# Patient Record
Sex: Female | Born: 1960 | Race: White | Hispanic: No | Marital: Married | Smoking: Never smoker
Health system: Southern US, Community
[De-identification: ages and names within clinical notes are randomized; demographics above are authoritative.]

## PROBLEM LIST (undated history)

## (undated) ENCOUNTER — Ambulatory Visit: Payer: Federal, State, Local not specified - PPO | Source: Home / Self Care

## (undated) DIAGNOSIS — S270XXA Traumatic pneumothorax, initial encounter: Secondary | ICD-10-CM

## (undated) DIAGNOSIS — S02401A Maxillary fracture, unspecified, initial encounter for closed fracture: Secondary | ICD-10-CM

## (undated) DIAGNOSIS — S62501A Fracture of unspecified phalanx of right thumb, initial encounter for closed fracture: Secondary | ICD-10-CM

## (undated) DIAGNOSIS — F329 Major depressive disorder, single episode, unspecified: Secondary | ICD-10-CM

## (undated) DIAGNOSIS — S022XXA Fracture of nasal bones, initial encounter for closed fracture: Secondary | ICD-10-CM

## (undated) DIAGNOSIS — S032XXA Dislocation of tooth, initial encounter: Secondary | ICD-10-CM

## (undated) DIAGNOSIS — F32A Depression, unspecified: Secondary | ICD-10-CM

## (undated) DIAGNOSIS — S0101XA Laceration without foreign body of scalp, initial encounter: Secondary | ICD-10-CM

## (undated) DIAGNOSIS — F430 Acute stress reaction: Secondary | ICD-10-CM

## (undated) DIAGNOSIS — S2242XA Multiple fractures of ribs, left side, initial encounter for closed fracture: Secondary | ICD-10-CM

## (undated) DIAGNOSIS — S0181XA Laceration without foreign body of other part of head, initial encounter: Secondary | ICD-10-CM

## (undated) DIAGNOSIS — S06369A Traumatic hemorrhage of cerebrum, unspecified, with loss of consciousness of unspecified duration, initial encounter: Secondary | ICD-10-CM

## (undated) HISTORY — PX: BREAST ENHANCEMENT SURGERY: SHX7

## (undated) HISTORY — PX: MYRINGOTOMY: SUR874

## (undated) HISTORY — PX: ADENOIDECTOMY: SUR15

## (undated) HISTORY — PX: TUBAL LIGATION: SHX77

## (undated) HISTORY — PX: TONSILLECTOMY: SUR1361

## (undated) HISTORY — PX: KNEE ARTHROSCOPY: SUR90

---
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
## 1998-02-26 ENCOUNTER — Other Ambulatory Visit: Admission: RE | Admit: 1998-02-26 | Discharge: 1998-02-26 | Payer: Self-pay | Admitting: *Deleted

## 1999-03-18 ENCOUNTER — Other Ambulatory Visit: Admission: RE | Admit: 1999-03-18 | Discharge: 1999-03-18 | Payer: Self-pay | Admitting: *Deleted

## 1999-08-17 ENCOUNTER — Emergency Department (HOSPITAL_COMMUNITY): Admission: EM | Admit: 1999-08-17 | Discharge: 1999-08-17 | Payer: Self-pay | Admitting: Emergency Medicine

## 2000-03-04 ENCOUNTER — Other Ambulatory Visit: Admission: RE | Admit: 2000-03-04 | Discharge: 2000-03-04 | Payer: Self-pay | Admitting: *Deleted

## 2001-03-13 ENCOUNTER — Other Ambulatory Visit: Admission: RE | Admit: 2001-03-13 | Discharge: 2001-03-13 | Payer: Self-pay | Admitting: Obstetrics and Gynecology

## 2002-03-29 ENCOUNTER — Other Ambulatory Visit: Admission: RE | Admit: 2002-03-29 | Discharge: 2002-03-29 | Payer: Self-pay | Admitting: Obstetrics and Gynecology

## 2004-11-24 ENCOUNTER — Encounter: Admission: RE | Admit: 2004-11-24 | Discharge: 2004-11-24 | Payer: Self-pay | Admitting: Internal Medicine

## 2005-05-27 ENCOUNTER — Encounter: Admission: RE | Admit: 2005-05-27 | Discharge: 2005-05-27 | Payer: Self-pay | Admitting: Internal Medicine

## 2009-01-27 ENCOUNTER — Encounter: Admission: RE | Admit: 2009-01-27 | Discharge: 2009-01-27 | Payer: Self-pay | Admitting: Neurology

## 2012-06-05 ENCOUNTER — Inpatient Hospital Stay (HOSPITAL_COMMUNITY)
Admission: EM | Admit: 2012-06-05 | Discharge: 2012-06-08 | DRG: 964 | Disposition: A | Discharge status: Home / Self Care | Attending: General Surgery | Admitting: General Surgery

## 2012-06-05 ENCOUNTER — Emergency Department (HOSPITAL_COMMUNITY)

## 2012-06-05 ENCOUNTER — Inpatient Hospital Stay (HOSPITAL_COMMUNITY)

## 2012-06-05 ENCOUNTER — Encounter (HOSPITAL_COMMUNITY): Payer: Self-pay | Admitting: Radiology

## 2012-06-05 ENCOUNTER — Other Ambulatory Visit: Payer: Self-pay

## 2012-06-05 DIAGNOSIS — S06300A Unspecified focal traumatic brain injury without loss of consciousness, initial encounter: Secondary | ICD-10-CM | POA: Diagnosis present

## 2012-06-05 DIAGNOSIS — S06369A Traumatic hemorrhage of cerebrum, unspecified, with loss of consciousness of unspecified duration, initial encounter: Secondary | ICD-10-CM

## 2012-06-05 DIAGNOSIS — S032XXA Dislocation of tooth, initial encounter: Secondary | ICD-10-CM

## 2012-06-05 DIAGNOSIS — S270XXA Traumatic pneumothorax, initial encounter: Secondary | ICD-10-CM | POA: Diagnosis present

## 2012-06-05 DIAGNOSIS — S62305A Unspecified fracture of fourth metacarpal bone, left hand, initial encounter for closed fracture: Secondary | ICD-10-CM

## 2012-06-05 DIAGNOSIS — S62335A Displaced fracture of neck of fourth metacarpal bone, left hand, initial encounter for closed fracture: Secondary | ICD-10-CM

## 2012-06-05 DIAGNOSIS — F329 Major depressive disorder, single episode, unspecified: Secondary | ICD-10-CM | POA: Diagnosis present

## 2012-06-05 DIAGNOSIS — S0101XA Laceration without foreign body of scalp, initial encounter: Secondary | ICD-10-CM

## 2012-06-05 DIAGNOSIS — S0636AA Traumatic hemorrhage of cerebrum, unspecified, with loss of consciousness status unknown, initial encounter: Secondary | ICD-10-CM

## 2012-06-05 DIAGNOSIS — Z79899 Other long term (current) drug therapy: Secondary | ICD-10-CM

## 2012-06-05 DIAGNOSIS — S0292XA Unspecified fracture of facial bones, initial encounter for closed fracture: Secondary | ICD-10-CM

## 2012-06-05 DIAGNOSIS — S025XXA Fracture of tooth (traumatic), initial encounter for closed fracture: Secondary | ICD-10-CM | POA: Diagnosis present

## 2012-06-05 DIAGNOSIS — H8110 Benign paroxysmal vertigo, unspecified ear: Secondary | ICD-10-CM | POA: Diagnosis present

## 2012-06-05 DIAGNOSIS — S02401A Maxillary fracture, unspecified, initial encounter for closed fracture: Secondary | ICD-10-CM

## 2012-06-05 DIAGNOSIS — S0181XA Laceration without foreign body of other part of head, initial encounter: Secondary | ICD-10-CM

## 2012-06-05 DIAGNOSIS — S0100XA Unspecified open wound of scalp, initial encounter: Secondary | ICD-10-CM | POA: Diagnosis present

## 2012-06-05 DIAGNOSIS — S2249XA Multiple fractures of ribs, unspecified side, initial encounter for closed fracture: Secondary | ICD-10-CM

## 2012-06-05 DIAGNOSIS — J939 Pneumothorax, unspecified: Secondary | ICD-10-CM

## 2012-06-05 DIAGNOSIS — S022XXA Fracture of nasal bones, initial encounter for closed fracture: Secondary | ICD-10-CM | POA: Diagnosis present

## 2012-06-05 DIAGNOSIS — T07XXXA Unspecified multiple injuries, initial encounter: Secondary | ICD-10-CM

## 2012-06-05 DIAGNOSIS — S62639A Displaced fracture of distal phalanx of unspecified finger, initial encounter for closed fracture: Secondary | ICD-10-CM | POA: Diagnosis present

## 2012-06-05 DIAGNOSIS — S02400A Malar fracture unspecified, initial encounter for closed fracture: Secondary | ICD-10-CM

## 2012-06-05 DIAGNOSIS — S0180XA Unspecified open wound of other part of head, initial encounter: Secondary | ICD-10-CM | POA: Diagnosis present

## 2012-06-05 DIAGNOSIS — F3289 Other specified depressive episodes: Secondary | ICD-10-CM | POA: Diagnosis present

## 2012-06-05 DIAGNOSIS — F430 Acute stress reaction: Secondary | ICD-10-CM | POA: Diagnosis present

## 2012-06-05 DIAGNOSIS — S62501A Fracture of unspecified phalanx of right thumb, initial encounter for closed fracture: Secondary | ICD-10-CM

## 2012-06-05 DIAGNOSIS — S2231XA Fracture of one rib, right side, initial encounter for closed fracture: Secondary | ICD-10-CM

## 2012-06-05 DIAGNOSIS — S62339A Displaced fracture of neck of unspecified metacarpal bone, initial encounter for closed fracture: Secondary | ICD-10-CM | POA: Diagnosis present

## 2012-06-05 DIAGNOSIS — S2242XA Multiple fractures of ribs, left side, initial encounter for closed fracture: Secondary | ICD-10-CM

## 2012-06-05 HISTORY — DX: Maxillary fracture, unspecified side, initial encounter for closed fracture: S02.401A

## 2012-06-05 HISTORY — DX: Traumatic hemorrhage of cerebrum, unspecified, with loss of consciousness of unspecified duration, initial encounter: S06.369A

## 2012-06-05 HISTORY — DX: Traumatic hemorrhage of cerebrum, unspecified, with loss of consciousness status unknown, initial encounter: S06.36AA

## 2012-06-05 HISTORY — DX: Laceration without foreign body of other part of head, initial encounter: S01.81XA

## 2012-06-05 HISTORY — DX: Laceration without foreign body of scalp, initial encounter: S01.01XA

## 2012-06-05 HISTORY — DX: Dislocation of tooth, initial encounter: S03.2XXA

## 2012-06-05 HISTORY — DX: Major depressive disorder, single episode, unspecified: F32.9

## 2012-06-05 HISTORY — DX: Depression, unspecified: F32.A

## 2012-06-05 LAB — TYPE AND SCREEN
ABO/RH(D): A POS
Antibody Screen: NEGATIVE
Unit division: 0

## 2012-06-05 LAB — COMPREHENSIVE METABOLIC PANEL
ALT: 16 U/L (ref 0–35)
Alkaline Phosphatase: 71 U/L (ref 39–117)
BUN: 13 mg/dL (ref 6–23)
CO2: 19 mEq/L (ref 19–32)
Calcium: 9.1 mg/dL (ref 8.4–10.5)
GFR calc Af Amer: 84 mL/min — ABNORMAL LOW (ref >90)
GFR calc non Af Amer: 73 mL/min — ABNORMAL LOW (ref >90)
Glucose, Bld: 185 mg/dL — ABNORMAL HIGH (ref 70–99)
Potassium: 3 mEq/L — ABNORMAL LOW (ref 3.5–5.1)
Sodium: 140 mEq/L (ref 135–145)

## 2012-06-05 LAB — URINALYSIS, MICROSCOPIC ONLY
Leukocytes, UA: NEGATIVE
Nitrite: NEGATIVE
Protein, ur: NEGATIVE mg/dL
Urobilinogen, UA: 0.2 mg/dL (ref 0.0–1.0)

## 2012-06-05 LAB — POCT I-STAT, CHEM 8
BUN: 12 mg/dL (ref 6–23)
Calcium, Ion: 1.08 mmol/L — ABNORMAL LOW (ref 1.12–1.23)
Chloride: 102 mEq/L (ref 96–112)
Glucose, Bld: 185 mg/dL — ABNORMAL HIGH (ref 70–99)

## 2012-06-05 LAB — CG4 I-STAT (LACTIC ACID): Lactic Acid, Venous: 7.34 mmol/L — ABNORMAL HIGH (ref 0.5–2.2)

## 2012-06-05 LAB — CBC
MCHC: 34.3 g/dL (ref 30.0–36.0)
RDW: 12.9 % (ref 11.5–15.5)

## 2012-06-05 LAB — ABO/RH: ABO/RH(D): A POS

## 2012-06-05 LAB — MRSA PCR SCREENING: MRSA by PCR: NEGATIVE

## 2012-06-05 MED ORDER — HYDROCODONE-ACETAMINOPHEN 5-325 MG PO TABS
1.0000 | ORAL_TABLET | ORAL | Status: DC | PRN
  Administered 2012-06-05 – 2012-06-06 (×2): 1 via ORAL
  Filled 2012-06-05 (×4): qty 1

## 2012-06-05 MED ORDER — FENTANYL CITRATE 0.05 MG/ML IJ SOLN
50.0000 ug | Freq: Once | INTRAMUSCULAR | Status: AC
  Administered 2012-06-05: 50 ug via INTRAVENOUS

## 2012-06-05 MED ORDER — HYDROCODONE-ACETAMINOPHEN 5-325 MG PO TABS
2.0000 | ORAL_TABLET | ORAL | Status: DC | PRN
  Administered 2012-06-06 – 2012-06-08 (×8): 2 via ORAL
  Filled 2012-06-05 (×7): qty 2

## 2012-06-05 MED ORDER — CITALOPRAM HYDROBROMIDE 40 MG PO TABS
40.0000 mg | ORAL_TABLET | Freq: Every day | ORAL | Status: DC
  Administered 2012-06-06 – 2012-06-08 (×3): 40 mg via ORAL
  Filled 2012-06-05 (×4): qty 1

## 2012-06-05 MED ORDER — LIDOCAINE HCL (PF) 1 % IJ SOLN
INTRAMUSCULAR | Status: AC
  Filled 2012-06-05: qty 15

## 2012-06-05 MED ORDER — POTASSIUM CHLORIDE IN NACL 20-0.45 MEQ/L-% IV SOLN
INTRAVENOUS | Status: DC
  Administered 2012-06-05 – 2012-06-06 (×3): via INTRAVENOUS
  Filled 2012-06-05 (×5): qty 1000

## 2012-06-05 MED ORDER — ONDANSETRON HCL 4 MG/2ML IJ SOLN: 4.0000 mg | Freq: Four times a day (QID) | INTRAMUSCULAR | Status: DC | PRN

## 2012-06-05 MED ORDER — MORPHINE SULFATE 4 MG/ML IJ SOLN
4.0000 mg | INTRAMUSCULAR | Status: DC | PRN
  Administered 2012-06-05: 4 mg via INTRAVENOUS
  Filled 2012-06-05 (×2): qty 1

## 2012-06-05 MED ORDER — ONDANSETRON HCL 4 MG/2ML IJ SOLN
INTRAMUSCULAR | Status: AC
  Filled 2012-06-05: qty 2

## 2012-06-05 MED ORDER — BIOTENE DRY MOUTH MT LIQD
15.0000 mL | Freq: Two times a day (BID) | OROMUCOSAL | Status: DC
  Administered 2012-06-06 – 2012-06-08 (×3): 15 mL via OROMUCOSAL

## 2012-06-05 MED ORDER — IOHEXOL 300 MG/ML  SOLN
100.0000 mL | Freq: Once | INTRAMUSCULAR | Status: AC | PRN
  Administered 2012-06-05: 100 mL via INTRAVENOUS

## 2012-06-05 MED ORDER — ONDANSETRON HCL 4 MG PO TABS: 4.0000 mg | ORAL_TABLET | Freq: Four times a day (QID) | ORAL | Status: DC | PRN

## 2012-06-05 MED ORDER — PROMETHAZINE HCL 25 MG/ML IJ SOLN
25.0000 mg | Freq: Four times a day (QID) | INTRAMUSCULAR | Status: DC | PRN
  Administered 2012-06-05: 25 mg via INTRAVENOUS
  Filled 2012-06-05: qty 1

## 2012-06-05 MED ORDER — SODIUM CHLORIDE 0.9 % IV SOLN
Freq: Once | INTRAVENOUS | Status: AC
  Administered 2012-06-05: 07:00:00 via INTRAVENOUS

## 2012-06-05 MED ORDER — HYDROCODONE-ACETAMINOPHEN 5-325 MG PO TABS: 0.5000 | ORAL_TABLET | ORAL | Status: DC | PRN

## 2012-06-05 MED ORDER — MORPHINE SULFATE 4 MG/ML IJ SOLN
4.0000 mg | INTRAMUSCULAR | Status: DC | PRN
  Administered 2012-06-05 – 2012-06-07 (×5): 4 mg via INTRAVENOUS
  Filled 2012-06-05 (×4): qty 1

## 2012-06-05 MED ORDER — ONDANSETRON HCL 4 MG/2ML IJ SOLN
4.0000 mg | Freq: Once | INTRAMUSCULAR | Status: AC
  Administered 2012-06-05: 4 mg via INTRAVENOUS

## 2012-06-05 MED ORDER — POLYETHYLENE GLYCOL 3350 17 G PO PACK
17.0000 g | PACK | Freq: Every day | ORAL | Status: DC
  Administered 2012-06-06 – 2012-06-07 (×2): 17 g via ORAL
  Filled 2012-06-05 (×4): qty 1

## 2012-06-05 MED ORDER — DOCUSATE SODIUM 100 MG PO CAPS
100.0000 mg | ORAL_CAPSULE | Freq: Two times a day (BID) | ORAL | Status: DC
  Administered 2012-06-05 – 2012-06-08 (×6): 100 mg via ORAL
  Filled 2012-06-05 (×6): qty 1

## 2012-06-05 MED ORDER — FENTANYL CITRATE 0.05 MG/ML IJ SOLN
INTRAMUSCULAR | Status: AC
  Filled 2012-06-05: qty 2

## 2012-06-05 MED ORDER — WHITE PETROLATUM GEL
Status: AC
  Filled 2012-06-05: qty 5

## 2012-06-05 MED ORDER — AMPHETAMINE-DEXTROAMPHETAMINE 20 MG PO TABS: 20.0000 mg | ORAL_TABLET | Freq: Every day | ORAL | Status: DC

## 2012-06-05 MED ORDER — CHLORHEXIDINE GLUCONATE 0.12 % MT SOLN
15.0000 mL | Freq: Two times a day (BID) | OROMUCOSAL | Status: DC
  Administered 2012-06-05 – 2012-06-08 (×5): 15 mL via OROMUCOSAL
  Filled 2012-06-05 (×9): qty 15
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
## 2012-06-05 NOTE — ED Notes (Signed)
Pt speaking with friend at [facility]. friend given password. Husband contacted "on the way", [employer] present.
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
## 2012-06-05 NOTE — Consult Note (Signed)
Reviewed XRays  Will need to see in my office Thursday for repeat films  May need ORIF
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
## 2012-06-05 NOTE — ED Notes (Signed)
To CT, no changes, alert, NAD, calm, interactive, VSS.

## 2012-06-05 NOTE — ED Notes (Addendum)
Patient Contact Information: Loraine Leriche (Spouse) [phone] Patient states it ok to contact spouse and let him know she is at out facility.

## 2012-06-05 NOTE — ED Notes (Signed)
Provider at the bedside to staple laceration to scalp.

## 2012-06-05 NOTE — ED Notes (Signed)
Dr. Dierdre Highman at [facility] for primary survey, Dr. Luisa Hart Trauma at [facility].

## 2012-06-05 NOTE — ED Notes (Signed)
Trauma MD at the bedside. 
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
## 2012-06-05 NOTE — ED Notes (Signed)
Pt out of CT, now in xray, alert, NAD, calm, interactive, VSS, no changes, tolerating radiology.
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
## 2012-06-05 NOTE — Progress Notes (Signed)
UR completed 

## 2012-06-06 ENCOUNTER — Inpatient Hospital Stay (HOSPITAL_COMMUNITY)

## 2012-06-06 LAB — BASIC METABOLIC PANEL
Chloride: 104 mEq/L (ref 96–112)
Creatinine, Ser: 0.74 mg/dL (ref 0.50–1.10)
GFR calc Af Amer: >90 mL/min (ref >90)
Potassium: 3.9 mEq/L (ref 3.5–5.1)
Sodium: 138 mEq/L (ref 135–145)

## 2012-06-06 LAB — CBC
Platelets: 212 10*3/uL (ref 150–400)
RBC: 3.39 MIL/uL — ABNORMAL LOW (ref 3.87–5.11)
RDW: 12.9 % (ref 11.5–15.5)
WBC: 4.7 10*3/uL (ref 4.0–10.5)

## 2012-06-06 MED ORDER — AMPHETAMINE-DEXTROAMPHETAMINE 10 MG PO TABS
20.0000 mg | ORAL_TABLET | Freq: Every day | ORAL | Status: DC
  Administered 2012-06-07 – 2012-06-08 (×2): 20 mg via ORAL
  Filled 2012-06-06 (×2): qty 2

## 2012-06-06 MED ORDER — BACITRACIN ZINC 500 UNIT/GM EX OINT
TOPICAL_OINTMENT | Freq: Two times a day (BID) | CUTANEOUS | Status: DC
  Administered 2012-06-06 – 2012-06-08 (×5): via TOPICAL
  Filled 2012-06-06: qty 15

## 2012-06-06 NOTE — Progress Notes (Signed)
Patient seen and examined.  Left ptx enlarging some but I do not think she needs a chest tube at this time.  Will add oxygen and recheck CXR tomorrow or today if she becomes symptomatic.
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
## 2012-06-06 NOTE — ED Provider Notes (Signed)
Medical screening examination/treatment/procedure(s) were conducted as a shared visit with non-physician practitioner(s) and myself.  I personally evaluated the patient during the encounter  Sansa Alkema, MD 06/06/12 0259 

## 2012-06-06 NOTE — Progress Notes (Signed)
Orthopedic Tech Progress Note Patient Details:  Molly [name] 1960-12-11 [id number] Ulna gutter splint applied to Left hand and wrist. 3rd, 4th & 5th fingers enclosed in splint to ensure total protection of 4th metacarpal. Patient tolerated application well. Stated splint was neither too hot nor too cold.  Ortho Devices Type of Ortho Device: Ulna gutter splint Ortho Device/Splint Location: Left Ortho Device/Splint Interventions: Application   Asia R Thompson 06/06/2012, 11:14 AM
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
## 2012-06-06 NOTE — Progress Notes (Signed)
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 (non-operative) and Nasal Bones.     1. Continue oral hygiene 2. Nasal spine is non-operative 3. Will reassess nasal bones after decrease in edema 4. Composite splint with remain for 6 to 8 weeks to allow for healing. She will require follow up in my office in 2 weeks.   5. Soft non-chew diet.     [place],Yeimy Brabant L 06/06/2012, 7:31 PM

## 2012-06-06 NOTE — ED Provider Notes (Signed)
Medical screening examination/treatment/procedure(s) were conducted as a shared visit with non-physician practitioner(s) and myself.  I personally evaluated the patient during the encounter  Sunnie Nielsen, MD 06/06/12 [phone]

## 2012-06-07 ENCOUNTER — Inpatient Hospital Stay (HOSPITAL_COMMUNITY)

## 2012-06-07 LAB — CBC
HCT: 29.5 % — ABNORMAL LOW (ref 36.0–46.0)
Hemoglobin: 10.1 g/dL — ABNORMAL LOW (ref 12.0–15.0)
RBC: 3.35 MIL/uL — ABNORMAL LOW (ref 3.87–5.11)
RDW: 12.9 % (ref 11.5–15.5)
WBC: 6.4 10*3/uL (ref 4.0–10.5)

## 2012-06-07 MED ORDER — HYDROMORPHONE HCL PF 1 MG/ML IJ SOLN: 0.5000 mg | INTRAMUSCULAR | Status: DC | PRN
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
## 2012-06-07 NOTE — Progress Notes (Signed)
Trauma Service Note  Subjective: Patient is doing much better.  Alert and awake.  Has BPPV.  Objective: Vital signs in last 24 hours: Temp:  [98 F (36.7 C)-99.7 F (37.6 C)] 98.6 F (37 C) (03/26 0800) Pulse Rate:  [74-83] 83 (03/26 0325) Resp:  [13-18] 15 (03/26 0325) BP: (109-121)/(53-69) 121/62 mmHg (03/26 0325) SpO2:  [96 %-100 %] 97 % (03/26 0325) Last BM Date: 06/05/12  Intake/Output from previous day: 03/25 0701 - 03/26 0700 In: 1930 [P.O.:1680; I.V.:250] Out: 500 [Urine:500] Intake/Output this shift:    General: No acute distress  Lungs: Clear to ausculation.  Small left apical PTX not expanded much  Abd: Benign  Extremities: Lefr hand in splint.  Right hand still swollen. Now has thumb pain  Neuro: Intact.  CT yesterday shows no worsening of possible ICC  Lab Results: CBC   Recent Labs  06/06/12 0448 06/07/12 0445  WBC 4.7 6.4  HGB 9.9* 10.1*  HCT 29.2* 29.5*  PLT 212 204   BMET  Recent Labs  06/05/12 0548 06/06/12 0448  NA 140 138  K 3.0* 3.9  CL 100 104  CO2 19 26  GLUCOSE 185* 106*  BUN 13 7  CREATININE 0.89 0.74  CALCIUM 9.1 8.6   PT/INR  Recent Labs  06/05/12 0548  LABPROT 13.5  INR 1.04   ABG No results found for this basename: PHART, PCO2, PO2, HCO3,  in the last 72 hours  Studies/Results: Ct Head Wo Contrast  06/06/2012  *RADIOLOGY REPORT*  Clinical Data: Trauma, headache, follow-up traumatic brain injury  CT HEAD WITHOUT CONTRAST  Technique:  Contiguous axial images were obtained from the base of the skull through the vertex without contrast.  Comparison: 06/05/2012  Findings: Normal ventricular morphology. No midline shift or mass effect. Area of previously questioned subtle increased attenuation at the right frontal region is slightly less prominent. No new areas of intracranial hemorrhage, mass lesion, or acute infarction identified. Again identified scalp hematoma biparietal. Bones and sinuses unremarkable.  IMPRESSION:  Vague area of minimally increased attenuation at the right frontal region on the previous exam is slightly less prominent. No new intracranial abnormalities.   Original Report Authenticated By: Ulyses Southward, M.D.    Dg Chest Port 1 View  06/07/2012  *RADIOLOGY REPORT*  Clinical Data: Follow up pneumothorax  PORTABLE CHEST - 1 VIEW  Comparison: 06/06/2012  Findings: Small left apical pneumothorax.  Mild patchy retrocardiac/left lower lobe opacity, possibly atelectasis.  No pleural effusion.  The heart is normal size.  IMPRESSION: Small left apical pneumothorax.   Original Report Authenticated By: Charline Bills, M.D.    Dg Chest Port 1 View  06/06/2012  *RADIOLOGY REPORT*  Clinical Data: Pneumothorax  PORTABLE CHEST - 1 VIEW  Comparison: Yesterday  Findings: Left pneumothorax is now 5%.  Low volumes.  Basilar hypoaeration.  Normal heart size.  IMPRESSION: Left pneumothorax is now 5% and increased.  Bibasilar hypoaeration.   Original Report Authenticated By: Jolaine Click, M.D.    Dg Cerv Spine Flex&ext Only  06/05/2012  *RADIOLOGY REPORT*  Clinical Data: Trauma and posterior neck soreness.  CERVICAL SPINE - FLEXION AND EXTENSION VIEWS ONLY  Comparison: Cervical spine CT 06/05/2012  Findings: Flexion and extension views of the cervical spine were obtained.  Cervical spine is visualized down to C6 on the flexion views and C7 on the extension views.  The prevertebral soft tissues are within normal limits. There is minimal retrolisthesis at C3-C4, C4-C5 and C5-C6 on the extension view.  No  gross abnormality to the facet joints.  Good range of motion between flexion and extension views.  IMPRESSION: No evidence for pathologic motion on the flexion and extension views.   Original Report Authenticated By: Richarda Overlie, M.D.     Anti-infectives: Anti-infectives   None      Assessment/Plan: s/p  Plan for discharge tomorrow Transfer to the floor  Possible discharge soon.   LOS: 2 days   Marta Lamas. Gae Bon, MD, FACS [phone] Trauma Surgeon 06/07/2012
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
## 2012-06-07 NOTE — Progress Notes (Signed)
Transfer has been cancelled at this time. MD is aware.
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
## 2012-06-08 DIAGNOSIS — F430 Acute stress reaction: Secondary | ICD-10-CM | POA: Diagnosis present

## 2012-06-08 DIAGNOSIS — S62501A Fracture of unspecified phalanx of right thumb, initial encounter for closed fracture: Secondary | ICD-10-CM

## 2012-06-08 DIAGNOSIS — S270XXA Traumatic pneumothorax, initial encounter: Secondary | ICD-10-CM

## 2012-06-08 DIAGNOSIS — F32A Depression, unspecified: Secondary | ICD-10-CM | POA: Insufficient documentation

## 2012-06-08 DIAGNOSIS — S022XXA Fracture of nasal bones, initial encounter for closed fracture: Secondary | ICD-10-CM

## 2012-06-08 DIAGNOSIS — S2242XA Multiple fractures of ribs, left side, initial encounter for closed fracture: Secondary | ICD-10-CM

## 2012-06-08 DIAGNOSIS — F329 Major depressive disorder, single episode, unspecified: Secondary | ICD-10-CM | POA: Insufficient documentation

## 2012-06-08 HISTORY — DX: Traumatic pneumothorax, initial encounter: S27.0XXA

## 2012-06-08 HISTORY — DX: Multiple fractures of ribs, left side, initial encounter for closed fracture: S22.42XA

## 2012-06-08 HISTORY — DX: Acute stress reaction: F43.0

## 2012-06-08 HISTORY — DX: Fracture of nasal bones, initial encounter for closed fracture: S02.2XXA

## 2012-06-08 HISTORY — DX: Fracture of unspecified phalanx of right thumb, initial encounter for closed fracture: S62.501A

## 2012-06-08 MED ORDER — HYDROCODONE-ACETAMINOPHEN 5-325 MG PO TABS: 1.0000 | ORAL_TABLET | ORAL | Status: DC | PRN
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
## 2012-06-08 NOTE — Progress Notes (Signed)
Trauma Service Note  Subjective: Patietn doingmuch better today.  Vertigo has essentially resolved  Objective: Vital signs in last 24 hours: Temp:  [97.6 F (36.4 C)-99.2 F (37.3 C)] 97.6 F (36.4 C) (03/27 0800) Pulse Rate:  [67-101] 80 (03/27 0800) Resp:  [14-15] 14 (03/27 0800) BP: (103-141)/(61-81) 129/81 mmHg (03/27 0800) SpO2:  [93 %-99 %] 96 % (03/27 0800) Last BM Date: 06/05/12 (per pt. report)  Intake/Output from previous day: 03/26 0701 - 03/27 0700 In: 240 [P.O.:240] Out: -  Intake/Output this shift:    General: No acute distress.  Still having some nightmares  Lungs: Clear  Abd: soft, nontender  Extremities: Left hand in splint.  Arranging for proper follow up  Neuro:  Intact  Lab Results: CBC   Recent Labs  06/06/12 0448 06/07/12 0445  WBC 4.7 6.4  HGB 9.9* 10.1*  HCT 29.2* 29.5*  PLT 212 204   BMET  Recent Labs  06/06/12 0448  NA 138  K 3.9  CL 104  CO2 26  GLUCOSE 106*  BUN 7  CREATININE 0.74  CALCIUM 8.6   PT/INR No results found for this basename: LABPROT, INR,  in the last 72 hours ABG No results found for this basename: PHART, PCO2, PO2, HCO3,  in the last 72 hours  Studies/Results: Dg Chest Port 1 View  06/07/2012  *RADIOLOGY REPORT*  Clinical Data: Follow up pneumothorax  PORTABLE CHEST - 1 VIEW  Comparison: 06/06/2012  Findings: Small left apical pneumothorax.  Mild patchy retrocardiac/left lower lobe opacity, possibly atelectasis.  No pleural effusion.  The heart is normal size.  IMPRESSION: Small left apical pneumothorax.   Original Report Authenticated By: Charline Bills, M.D.     Anti-infectives: Anti-infectives   None      Assessment/Plan: s/p  Discharge Arrange for outpatient Maxillo-facial and Hand surgery followup Will also need trauma clinic appointment to remove scalp staples and facial sutures.  LOS: 3 days   Marta Lamas. Gae Bon, MD, FACS [phone] Trauma Surgeon 06/08/2012
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
## 2012-06-08 NOTE — Progress Notes (Signed)
Discharge instructions given to pt and family.  All verbalized understanding with all questions answered.  Pt discharged to home with family  Roselie Awkward, RN

## 2012-06-12 ENCOUNTER — Ambulatory Visit (INDEPENDENT_AMBULATORY_CARE_PROVIDER_SITE_OTHER): Payer: Federal, State, Local not specified - PPO

## 2012-06-12 ENCOUNTER — Encounter (INDEPENDENT_AMBULATORY_CARE_PROVIDER_SITE_OTHER): Payer: Self-pay

## 2012-06-12 VITALS — BP 124/82 | HR 85 | Temp 98.4°F | Resp 18 | Ht 64.0 in | Wt 139.5 lb

## 2012-06-12 DIAGNOSIS — Z4802 Encounter for removal of sutures: Secondary | ICD-10-CM

## 2012-06-12 NOTE — Progress Notes (Signed)
Patient in for suture removal of face x 2 areas and staple removal of scalp . Dr Magnus Ivan assisted  Patient ordered for sutures of the face and staples of the head to be removed. Cleansed scalp incisions with Normal saline removed 21 staples; received assist from [facility] CMA which removed the sutures from rt eye brow area and left eye brow area. Applied steri stirps to both areas. Patient tolerated well . Advised patient to areas clean and dry. She may rinse her hair and pat  Dry. She is to call or go to the ER if she has a fever above 100.9  or serous drainage. Patient verbalized understanding

## 2012-06-13 ENCOUNTER — Telehealth (HOSPITAL_COMMUNITY): Payer: Self-pay | Admitting: Emergency Medicine

## 2012-06-13 NOTE — Telephone Encounter (Signed)
Patient had all head sutures and staples removed Monday and is having no wound issues so I will cancel her appointment on Friday.

## 2012-06-16 ENCOUNTER — Encounter (INDEPENDENT_AMBULATORY_CARE_PROVIDER_SITE_OTHER): Payer: BC Managed Care – PPO

## 2012-06-21 ENCOUNTER — Telehealth (HOSPITAL_COMMUNITY): Payer: Self-pay | Admitting: Emergency Medicine

## 2012-06-21 NOTE — Telephone Encounter (Signed)
Channing called with two concerns. One was that she was starting to have some problems with vertigo again with certain movements. I told her that she needed to see a vestibular trained PT and recommended [facility] OP therapy. I gave her a rx for OP therapy when she left but if she can't find that and they require another one she'll give me a call. The second issue was that she was seeing "floaters". She does not have an ophthalmologist that she's seen in a long time. Since no one from that specialty consulted while she was in the hospital I told her that she could pick who she wanted to see.

## 2012-06-22 ENCOUNTER — Telehealth (HOSPITAL_COMMUNITY): Payer: Self-pay | Admitting: Emergency Medicine
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
## 2012-07-05 ENCOUNTER — Ambulatory Visit: Attending: Orthopedic Surgery | Admitting: Physical Therapy

## 2012-07-05 DIAGNOSIS — H8110 Benign paroxysmal vertigo, unspecified ear: Secondary | ICD-10-CM | POA: Insufficient documentation

## 2012-07-05 DIAGNOSIS — IMO0001 Reserved for inherently not codable concepts without codable children: Secondary | ICD-10-CM | POA: Insufficient documentation

## 2012-07-05 DIAGNOSIS — R42 Dizziness and giddiness: Secondary | ICD-10-CM | POA: Insufficient documentation

## 2012-07-12 ENCOUNTER — Ambulatory Visit: Attending: Orthopedic Surgery | Admitting: *Deleted

## 2012-07-12 DIAGNOSIS — IMO0001 Reserved for inherently not codable concepts without codable children: Secondary | ICD-10-CM | POA: Insufficient documentation

## 2012-07-12 DIAGNOSIS — R42 Dizziness and giddiness: Secondary | ICD-10-CM | POA: Insufficient documentation

## 2012-07-12 DIAGNOSIS — H8110 Benign paroxysmal vertigo, unspecified ear: Secondary | ICD-10-CM | POA: Insufficient documentation

## 2012-07-19 ENCOUNTER — Ambulatory Visit: Attending: Orthopedic Surgery | Admitting: *Deleted

## 2012-07-19 DIAGNOSIS — H8110 Benign paroxysmal vertigo, unspecified ear: Secondary | ICD-10-CM | POA: Insufficient documentation

## 2012-07-19 DIAGNOSIS — IMO0001 Reserved for inherently not codable concepts without codable children: Secondary | ICD-10-CM | POA: Insufficient documentation

## 2012-07-19 DIAGNOSIS — R42 Dizziness and giddiness: Secondary | ICD-10-CM | POA: Insufficient documentation

## 2012-07-26 ENCOUNTER — Ambulatory Visit: Attending: Orthopedic Surgery | Admitting: *Deleted

## 2012-07-26 DIAGNOSIS — IMO0001 Reserved for inherently not codable concepts without codable children: Secondary | ICD-10-CM | POA: Insufficient documentation

## 2012-07-26 DIAGNOSIS — H8110 Benign paroxysmal vertigo, unspecified ear: Secondary | ICD-10-CM | POA: Insufficient documentation

## 2012-07-26 DIAGNOSIS — R42 Dizziness and giddiness: Secondary | ICD-10-CM | POA: Insufficient documentation

## 2012-08-02 ENCOUNTER — Ambulatory Visit: Attending: Orthopedic Surgery | Admitting: *Deleted

## 2012-08-02 DIAGNOSIS — H8110 Benign paroxysmal vertigo, unspecified ear: Secondary | ICD-10-CM | POA: Insufficient documentation

## 2012-08-02 DIAGNOSIS — IMO0001 Reserved for inherently not codable concepts without codable children: Secondary | ICD-10-CM | POA: Insufficient documentation

## 2012-08-02 DIAGNOSIS — R42 Dizziness and giddiness: Secondary | ICD-10-CM | POA: Insufficient documentation

## 2013-01-18 ENCOUNTER — Other Ambulatory Visit: Payer: Self-pay

## 2013-12-28 ENCOUNTER — Other Ambulatory Visit: Payer: Self-pay

## 2014-08-28 ENCOUNTER — Encounter: Payer: Self-pay | Admitting: Cardiology

## 2015-06-16 ENCOUNTER — Other Ambulatory Visit: Payer: Self-pay | Admitting: Obstetrics and Gynecology

## 2015-06-16 DIAGNOSIS — T8543XA Leakage of breast prosthesis and implant, initial encounter: Secondary | ICD-10-CM

## 2015-06-25 ENCOUNTER — Ambulatory Visit
Admission: RE | Admit: 2015-06-25 | Discharge: 2015-06-25 | Disposition: A | Discharge status: Home / Self Care | Payer: Federal, State, Local not specified - PPO | Source: Ambulatory Visit | Attending: Obstetrics and Gynecology | Admitting: Obstetrics and Gynecology

## 2015-06-25 DIAGNOSIS — N6489 Other specified disorders of breast: Secondary | ICD-10-CM | POA: Diagnosis not present

## 2015-06-25 DIAGNOSIS — T8543XA Leakage of breast prosthesis and implant, initial encounter: Secondary | ICD-10-CM

## 2015-09-10 DIAGNOSIS — Z1389 Encounter for screening for other disorder: Secondary | ICD-10-CM | POA: Diagnosis not present

## 2015-09-10 DIAGNOSIS — Z0000 Encounter for general adult medical examination without abnormal findings: Secondary | ICD-10-CM | POA: Diagnosis not present

## 2015-12-12 DIAGNOSIS — M797 Fibromyalgia: Secondary | ICD-10-CM | POA: Diagnosis not present

## 2015-12-12 DIAGNOSIS — F419 Anxiety disorder, unspecified: Secondary | ICD-10-CM | POA: Diagnosis not present

## 2015-12-12 DIAGNOSIS — F900 Attention-deficit hyperactivity disorder, predominantly inattentive type: Secondary | ICD-10-CM | POA: Diagnosis not present

## 2015-12-12 DIAGNOSIS — F4310 Post-traumatic stress disorder, unspecified: Secondary | ICD-10-CM | POA: Diagnosis not present

## 2016-04-05 DIAGNOSIS — T8549XA Other mechanical complication of breast prosthesis and implant, initial encounter: Secondary | ICD-10-CM | POA: Diagnosis not present

## 2016-04-20 DIAGNOSIS — Z01419 Encounter for gynecological examination (general) (routine) without abnormal findings: Secondary | ICD-10-CM | POA: Diagnosis not present

## 2016-04-20 DIAGNOSIS — Z1151 Encounter for screening for human papillomavirus (HPV): Secondary | ICD-10-CM | POA: Diagnosis not present

## 2016-04-20 DIAGNOSIS — Z6822 Body mass index (BMI) 22.0-22.9, adult: Secondary | ICD-10-CM | POA: Diagnosis not present

## 2016-04-20 DIAGNOSIS — R87610 Atypical squamous cells of undetermined significance on cytologic smear of cervix (ASC-US): Secondary | ICD-10-CM | POA: Diagnosis not present

## 2016-04-20 DIAGNOSIS — Z1231 Encounter for screening mammogram for malignant neoplasm of breast: Secondary | ICD-10-CM | POA: Diagnosis not present

## 2016-05-06 DIAGNOSIS — N870 Mild cervical dysplasia: Secondary | ICD-10-CM | POA: Diagnosis not present

## 2016-05-06 DIAGNOSIS — R87610 Atypical squamous cells of undetermined significance on cytologic smear of cervix (ASC-US): Secondary | ICD-10-CM | POA: Diagnosis not present

## 2016-05-06 DIAGNOSIS — R87810 Cervical high risk human papillomavirus (HPV) DNA test positive: Secondary | ICD-10-CM | POA: Diagnosis not present

## 2016-08-03 DIAGNOSIS — M1712 Unilateral primary osteoarthritis, left knee: Secondary | ICD-10-CM | POA: Diagnosis not present

## 2016-08-03 DIAGNOSIS — Z9889 Other specified postprocedural states: Secondary | ICD-10-CM | POA: Diagnosis not present

## 2016-08-03 DIAGNOSIS — M25562 Pain in left knee: Secondary | ICD-10-CM | POA: Diagnosis not present

## 2016-08-03 DIAGNOSIS — G8929 Other chronic pain: Secondary | ICD-10-CM | POA: Diagnosis not present

## 2016-09-10 DIAGNOSIS — Z0000 Encounter for general adult medical examination without abnormal findings: Secondary | ICD-10-CM | POA: Diagnosis not present

## 2016-09-10 DIAGNOSIS — F419 Anxiety disorder, unspecified: Secondary | ICD-10-CM | POA: Diagnosis not present

## 2016-09-10 DIAGNOSIS — Z1389 Encounter for screening for other disorder: Secondary | ICD-10-CM | POA: Diagnosis not present

## 2016-09-10 DIAGNOSIS — F900 Attention-deficit hyperactivity disorder, predominantly inattentive type: Secondary | ICD-10-CM | POA: Diagnosis not present

## 2016-09-10 DIAGNOSIS — M797 Fibromyalgia: Secondary | ICD-10-CM | POA: Diagnosis not present

## 2017-01-07 ENCOUNTER — Ambulatory Visit
Admission: RE | Admit: 2017-01-07 | Discharge: 2017-01-07 | Disposition: A | Discharge status: Home / Self Care | Payer: Federal, State, Local not specified - PPO | Source: Ambulatory Visit | Attending: Nurse Practitioner | Admitting: Nurse Practitioner

## 2017-01-07 ENCOUNTER — Other Ambulatory Visit: Payer: Self-pay | Admitting: Nurse Practitioner

## 2017-01-07 ENCOUNTER — Other Ambulatory Visit: Payer: Self-pay | Admitting: Internal Medicine

## 2017-01-07 DIAGNOSIS — R109 Unspecified abdominal pain: Secondary | ICD-10-CM | POA: Diagnosis not present

## 2017-01-07 DIAGNOSIS — R1031 Right lower quadrant pain: Secondary | ICD-10-CM

## 2017-01-07 MED ORDER — IOPAMIDOL (ISOVUE-300) INJECTION 61%: 100.0000 mL | Freq: Once | INTRAVENOUS | Status: DC | PRN

## 2017-03-22 DIAGNOSIS — M797 Fibromyalgia: Secondary | ICD-10-CM | POA: Diagnosis not present

## 2017-03-22 DIAGNOSIS — F900 Attention-deficit hyperactivity disorder, predominantly inattentive type: Secondary | ICD-10-CM | POA: Diagnosis not present

## 2017-03-22 DIAGNOSIS — F419 Anxiety disorder, unspecified: Secondary | ICD-10-CM | POA: Diagnosis not present

## 2017-03-22 DIAGNOSIS — Z23 Encounter for immunization: Secondary | ICD-10-CM | POA: Diagnosis not present

## 2017-04-21 DIAGNOSIS — Z01419 Encounter for gynecological examination (general) (routine) without abnormal findings: Secondary | ICD-10-CM | POA: Diagnosis not present

## 2017-04-21 DIAGNOSIS — Z1159 Encounter for screening for other viral diseases: Secondary | ICD-10-CM | POA: Diagnosis not present

## 2017-04-21 DIAGNOSIS — Z6825 Body mass index (BMI) 25.0-25.9, adult: Secondary | ICD-10-CM | POA: Diagnosis not present

## 2017-08-17 DIAGNOSIS — K080 Exfoliation of teeth due to systemic causes: Secondary | ICD-10-CM | POA: Diagnosis not present

## 2017-08-31 DIAGNOSIS — K080 Exfoliation of teeth due to systemic causes: Secondary | ICD-10-CM | POA: Diagnosis not present

## 2017-11-16 DIAGNOSIS — Z1159 Encounter for screening for other viral diseases: Secondary | ICD-10-CM | POA: Diagnosis not present

## 2017-11-16 DIAGNOSIS — Z13220 Encounter for screening for lipoid disorders: Secondary | ICD-10-CM | POA: Diagnosis not present

## 2017-11-16 DIAGNOSIS — M797 Fibromyalgia: Secondary | ICD-10-CM | POA: Diagnosis not present

## 2017-11-16 DIAGNOSIS — Z0000 Encounter for general adult medical examination without abnormal findings: Secondary | ICD-10-CM | POA: Diagnosis not present

## 2017-11-16 DIAGNOSIS — Z1389 Encounter for screening for other disorder: Secondary | ICD-10-CM | POA: Diagnosis not present

## 2017-11-16 DIAGNOSIS — F322 Major depressive disorder, single episode, severe without psychotic features: Secondary | ICD-10-CM | POA: Diagnosis not present

## 2017-11-16 DIAGNOSIS — R7309 Other abnormal glucose: Secondary | ICD-10-CM | POA: Diagnosis not present

## 2017-11-18 DIAGNOSIS — H20013 Primary iridocyclitis, bilateral: Secondary | ICD-10-CM | POA: Diagnosis not present

## 2017-11-23 DIAGNOSIS — H20013 Primary iridocyclitis, bilateral: Secondary | ICD-10-CM | POA: Diagnosis not present

## 2017-11-24 DIAGNOSIS — H209 Unspecified iridocyclitis: Secondary | ICD-10-CM | POA: Diagnosis not present

## 2017-11-28 ENCOUNTER — Telehealth: Payer: Self-pay | Admitting: Internal Medicine

## 2017-11-28 DIAGNOSIS — H30893 Other chorioretinal inflammations, bilateral: Secondary | ICD-10-CM | POA: Diagnosis not present

## 2017-11-28 DIAGNOSIS — H20033 Secondary infectious iridocyclitis, bilateral: Secondary | ICD-10-CM | POA: Diagnosis not present
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
## 2017-11-29 DIAGNOSIS — K080 Exfoliation of teeth due to systemic causes: Secondary | ICD-10-CM | POA: Diagnosis not present

## 2017-11-30 DIAGNOSIS — H35373 Puckering of macula, bilateral: Secondary | ICD-10-CM | POA: Diagnosis not present

## 2017-11-30 DIAGNOSIS — H3562 Retinal hemorrhage, left eye: Secondary | ICD-10-CM | POA: Diagnosis not present

## 2017-11-30 DIAGNOSIS — H20043 Secondary noninfectious iridocyclitis, bilateral: Secondary | ICD-10-CM | POA: Diagnosis not present

## 2017-11-30 DIAGNOSIS — H43811 Vitreous degeneration, right eye: Secondary | ICD-10-CM | POA: Diagnosis not present

## 2017-12-14 DIAGNOSIS — H43811 Vitreous degeneration, right eye: Secondary | ICD-10-CM | POA: Diagnosis not present

## 2017-12-14 DIAGNOSIS — H20043 Secondary noninfectious iridocyclitis, bilateral: Secondary | ICD-10-CM | POA: Diagnosis not present

## 2017-12-14 DIAGNOSIS — H35373 Puckering of macula, bilateral: Secondary | ICD-10-CM | POA: Diagnosis not present

## 2017-12-20 DIAGNOSIS — F322 Major depressive disorder, single episode, severe without psychotic features: Secondary | ICD-10-CM | POA: Diagnosis not present

## 2017-12-20 DIAGNOSIS — F419 Anxiety disorder, unspecified: Secondary | ICD-10-CM | POA: Diagnosis not present

## 2017-12-20 DIAGNOSIS — Z23 Encounter for immunization: Secondary | ICD-10-CM | POA: Diagnosis not present

## 2018-01-11 DIAGNOSIS — H20043 Secondary noninfectious iridocyclitis, bilateral: Secondary | ICD-10-CM | POA: Diagnosis not present

## 2018-01-11 DIAGNOSIS — H43811 Vitreous degeneration, right eye: Secondary | ICD-10-CM | POA: Diagnosis not present

## 2018-01-30 DIAGNOSIS — K080 Exfoliation of teeth due to systemic causes: Secondary | ICD-10-CM | POA: Diagnosis not present

## 2018-03-13 DIAGNOSIS — K080 Exfoliation of teeth due to systemic causes: Secondary | ICD-10-CM | POA: Diagnosis not present

## 2018-05-14 IMAGING — CT CT ABD-PELV W/ CM
3 of 5 series · 13 of 36 positions shown, 19 images · IV contrast (agent unspecified)
Comparison: None.

CLINICAL DATA: Right-sided abdominal pain

EXAM:
CT ABDOMEN AND PELVIS WITH CONTRAST
TECHNIQUE: Multidetector CT imaging of the abdomen and pelvis was performed
using the standard protocol following bolus administration of
intravenous contrast.
CONTRAST:  100 mL Fsovue-FDD.

[Series 3: abd/pelvis with · axial · 0.78mm/px · z∈[-328,-22]mm · 7 of 83 slices shown, 12 images]
[im 11/83  soft-tissue]
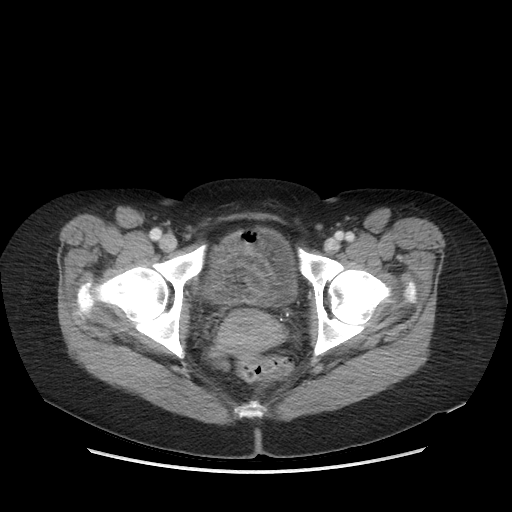
[im 11/83  bone]
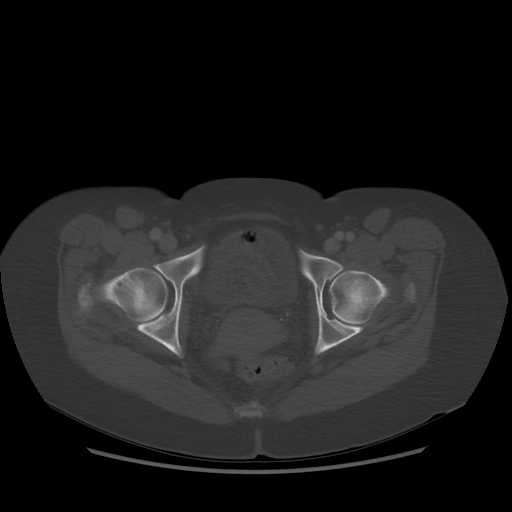
[im 21/83  soft-tissue]
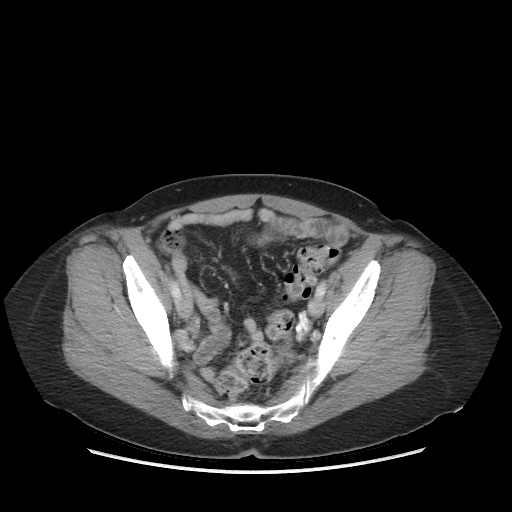
[im 31/83  soft-tissue]
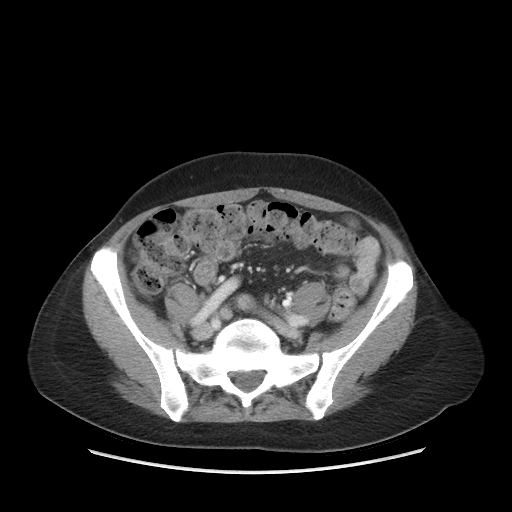
[im 42/83  soft-tissue]
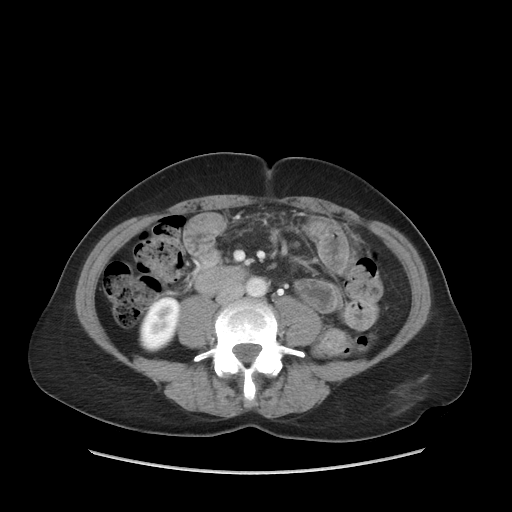
[im 42/83  lung]
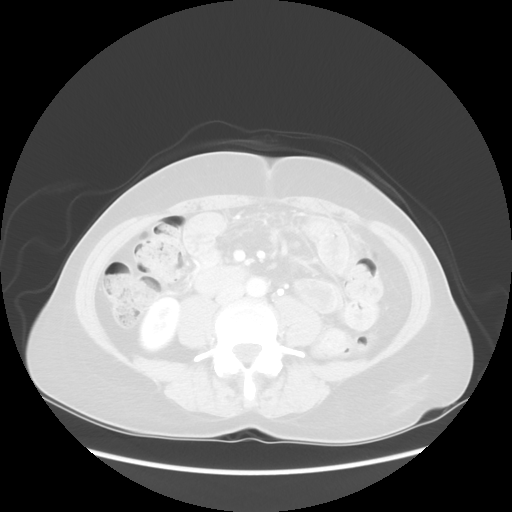
[im 52/83  soft-tissue]
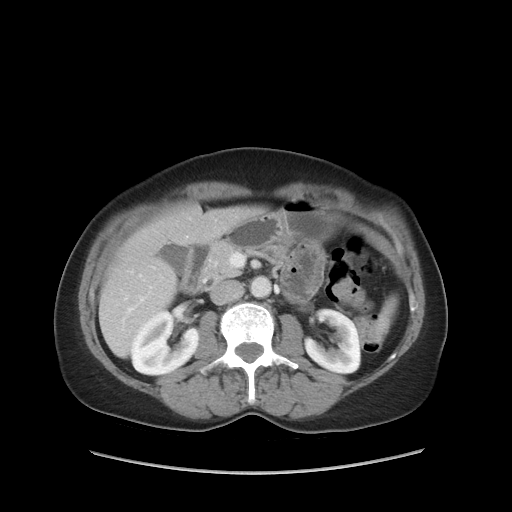
[im 52/83  lung]
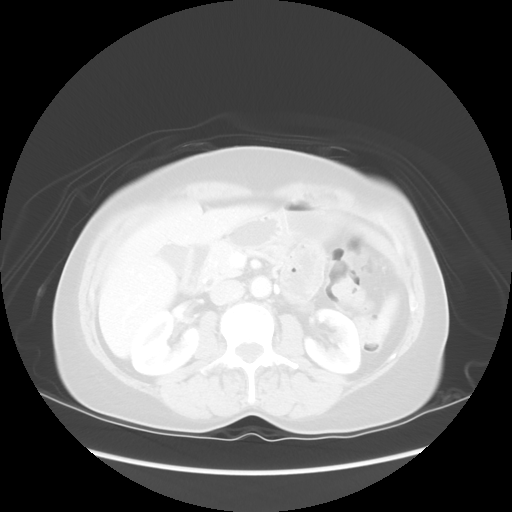
[im 62/83  soft-tissue]
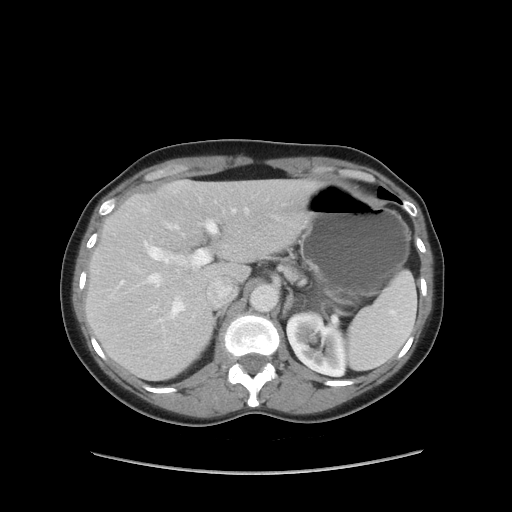
[im 62/83  lung]
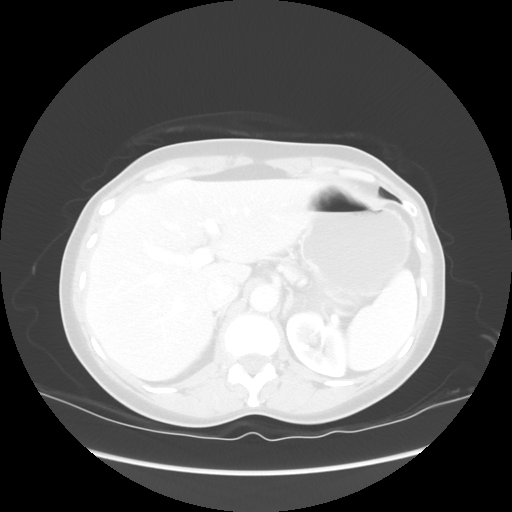
[im 72/83  soft-tissue]
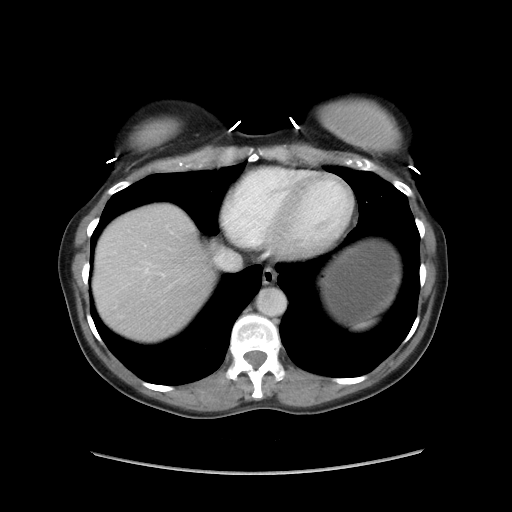
[im 72/83  lung]
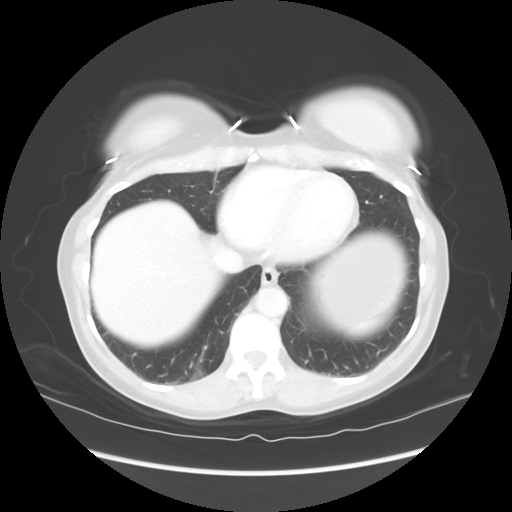

[Series 601: coronal body · coronal · 0.88mm/px · 1 of 127 slices shown, 2 images]
[im 43/127  soft-tissue]
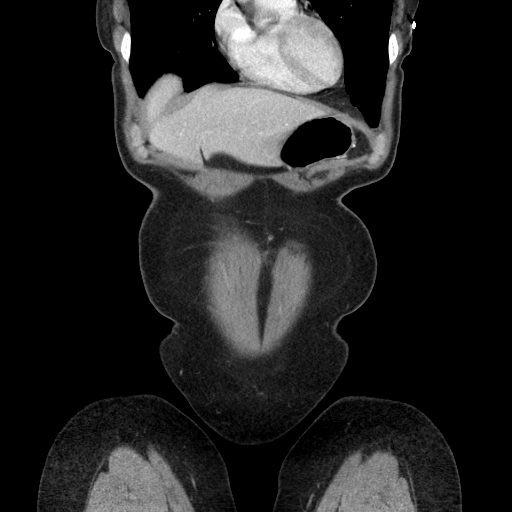
[im 43/127  bone]
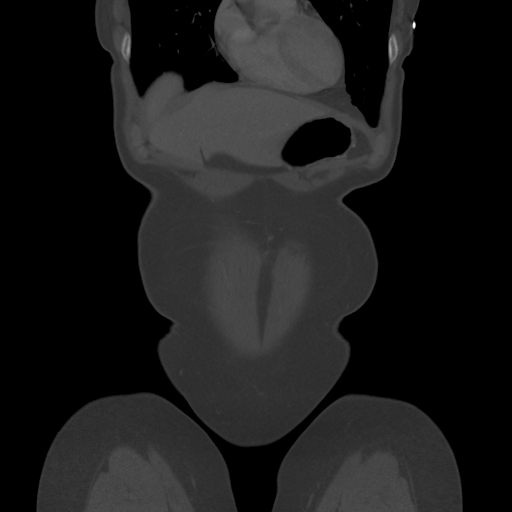

[Series 602: sagittal body · sagittal · 0.88mm/px · 5 of 162 slices shown]
[im 11/162  soft-tissue]
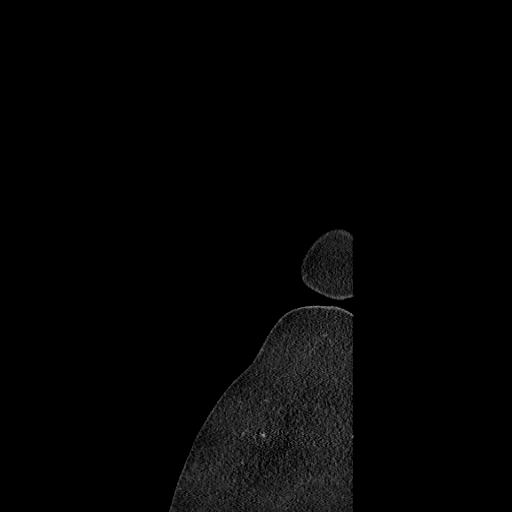
[im 31/162  soft-tissue]
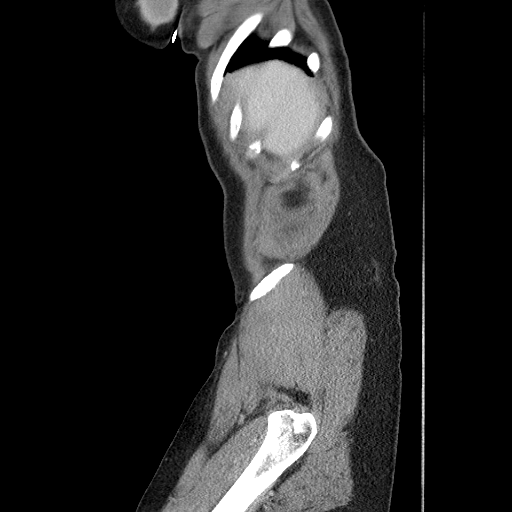
[im 51/162  soft-tissue]
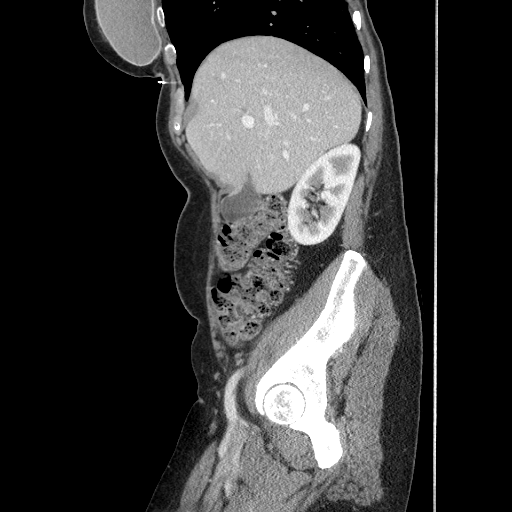
[im 71/162  soft-tissue]
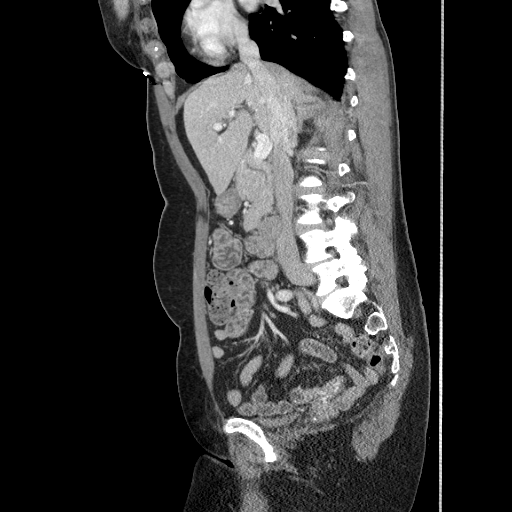
[im 91/162  soft-tissue]
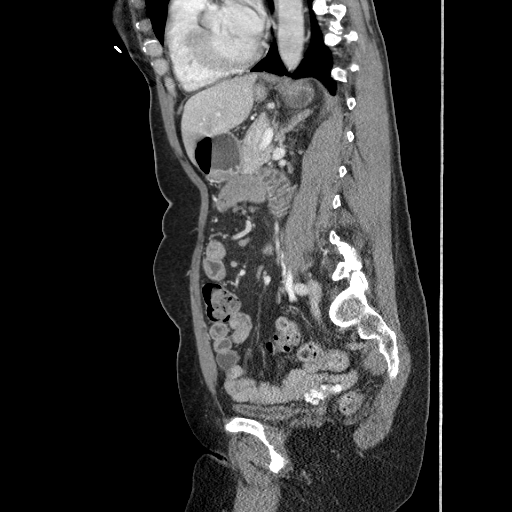

[13 of 36 positions shown; findings below may reference images not displayed]

FINDINGS: Lower chest: No acute abnormality. Bilateral breast implants are
noted.

Hepatobiliary: No focal liver abnormality is seen. No gallstones,
gallbladder wall thickening, or biliary dilatation.

Pancreas: Unremarkable. No pancreatic ductal dilatation or
surrounding inflammatory changes.

Spleen: Normal in size without focal abnormality.

Adrenals/Urinary Tract: The adrenal glands are within normal limits.
The kidneys are within normal limits without obstructive change or
renal calculi. The bladder is decompressed.

Stomach/Bowel: The appendix is within normal limits. Scattered
diverticular changes noted throughout colon without evidence of
diverticulitis. No obstructive changes are noted.

Vascular/Lymphatic: Mild vascular calcifications are noted without
aneurysmal dilatation. No significant lymphatic abnormality is
noted. Some prominent left ovarian veins are seen.

Reproductive: Uterus and bilateral adnexa are unremarkable.

Other: No abdominal wall hernia or abnormality. No abdominopelvic
ascites.

Musculoskeletal: Degenerative changes of the lumbar spine are noted.
No acute bony abnormality is seen.
IMPRESSION: No evidence of appendicitis.

Mildly prominent left ovarian veins.

No definitive acute abnormality to correspond with the patient's
clinical symptomatology is noted.

## 2018-07-11 DIAGNOSIS — F900 Attention-deficit hyperactivity disorder, predominantly inattentive type: Secondary | ICD-10-CM | POA: Diagnosis not present

## 2018-07-11 DIAGNOSIS — M797 Fibromyalgia: Secondary | ICD-10-CM | POA: Diagnosis not present

## 2018-07-11 DIAGNOSIS — M1990 Unspecified osteoarthritis, unspecified site: Secondary | ICD-10-CM | POA: Diagnosis not present

## 2018-07-11 DIAGNOSIS — F419 Anxiety disorder, unspecified: Secondary | ICD-10-CM | POA: Diagnosis not present

## 2018-08-02 DIAGNOSIS — F4310 Post-traumatic stress disorder, unspecified: Secondary | ICD-10-CM | POA: Diagnosis not present

## 2018-08-02 DIAGNOSIS — F322 Major depressive disorder, single episode, severe without psychotic features: Secondary | ICD-10-CM | POA: Diagnosis not present

## 2018-11-06 DIAGNOSIS — Z1151 Encounter for screening for human papillomavirus (HPV): Secondary | ICD-10-CM | POA: Diagnosis not present

## 2018-11-06 DIAGNOSIS — Z6826 Body mass index (BMI) 26.0-26.9, adult: Secondary | ICD-10-CM | POA: Diagnosis not present

## 2018-11-06 DIAGNOSIS — Z01419 Encounter for gynecological examination (general) (routine) without abnormal findings: Secondary | ICD-10-CM | POA: Diagnosis not present

## 2018-12-13 ENCOUNTER — Other Ambulatory Visit: Payer: Self-pay | Admitting: Internal Medicine

## 2018-12-13 ENCOUNTER — Ambulatory Visit
Admission: RE | Admit: 2018-12-13 | Discharge: 2018-12-13 | Disposition: A | Discharge status: Home / Self Care | Payer: Federal, State, Local not specified - PPO | Source: Ambulatory Visit | Attending: Internal Medicine | Admitting: Internal Medicine

## 2018-12-13 DIAGNOSIS — R0609 Other forms of dyspnea: Secondary | ICD-10-CM

## 2018-12-13 DIAGNOSIS — R0600 Dyspnea, unspecified: Secondary | ICD-10-CM

## 2019-01-04 ENCOUNTER — Telehealth: Payer: Self-pay | Admitting: *Deleted

## 2019-01-04 NOTE — Telephone Encounter (Signed)
Left message to call back Patient needs to be moved to 8:40 time spot for tomorrow morning

## 2019-01-04 NOTE — Telephone Encounter (Signed)
Patient agreeable to changing time per scheduling, appointment changed
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
## 2019-01-05 ENCOUNTER — Encounter: Payer: Self-pay | Admitting: Cardiology

## 2019-01-05 ENCOUNTER — Other Ambulatory Visit: Payer: Self-pay

## 2019-01-05 ENCOUNTER — Ambulatory Visit: Payer: Federal, State, Local not specified - PPO | Admitting: Cardiology

## 2019-01-05 VITALS — BP 140/86 | Ht 65.0 in | Wt 151.0 lb

## 2019-01-05 DIAGNOSIS — R0600 Dyspnea, unspecified: Secondary | ICD-10-CM | POA: Diagnosis not present

## 2019-01-05 DIAGNOSIS — R0609 Other forms of dyspnea: Secondary | ICD-10-CM

## 2019-01-05 NOTE — Patient Instructions (Signed)
Medication Instructions:  Your physician recommends that you continue on your current medications as directed. Please refer to the Current Medication list given to you today.  If you need a refill on your cardiac medications before your next appointment, please call your pharmacy.   Lab work: BNP If you have labs (blood work) drawn today and your tests are completely normal, you will receive your results only by: [place] (if you have MyChart) OR A paper copy in the mail If you have any lab test that is abnormal or we need to change your treatment, we will call you to review the results.  Testing/Procedures: Your physician has recommended that you wear an event monitor. Event monitors are medical devices that record the heart's electrical activity. Doctors most often [place] these monitors to diagnose arrhythmias. Arrhythmias are problems with the speed or rhythm of the heartbeat. The monitor is a small, portable device. You can wear one while you do your normal daily activities. This is usually used to diagnose what is causing palpitations/syncope (passing out). You will get a call from Markus Daft and it will come in the mail.   Your physician has requested that you have an exercise tolerance test. For further information please visit [email or web address]. Please also follow instruction sheet, as given.   Follow-Up: At [facility], you and your health needs are our priority.  As part of our continuing mission to provide you with exceptional heart care, we have created designated Provider Care Teams.  These Care Teams include your primary Cardiologist (physician) and Advanced Practice Providers (APPs -  Physician Assistants and Nurse Practitioners) who all work together to provide you with the care you need, when you need it. You may see Dr. Percival Spanish or one of the following Advanced Practice Providers on your designated Care Team:    Rosaria Ferries, PA-C  Jory Sims, DNP,  ANP  Cadence Kathlen Mody, NP  Your physician wants you to follow-up in: 2 months. You will receive a reminder letter in the mail two months in advance. If you don't receive a letter, please call our office to schedule the follow-up appointment.  Any Other Special Instructions Will Be Listed Below (If Applicable). You will have to have a Covid test scheduled prior to your treadmill test.

## 2019-01-06 LAB — BRAIN NATRIURETIC PEPTIDE: BNP: 14.6 pg/mL (ref 0.0–100.0)

## 2019-01-12 ENCOUNTER — Telehealth: Payer: Self-pay | Admitting: *Deleted

## 2019-01-12 NOTE — Telephone Encounter (Signed)
New message   Patient states that she has not received the heart monitor that was mailed last week. Please call.

## 2019-01-13 ENCOUNTER — Other Ambulatory Visit (HOSPITAL_COMMUNITY)
Admission: RE | Admit: 2019-01-13 | Discharge: 2019-01-13 | Disposition: A | Discharge status: Home / Self Care | Payer: Federal, State, Local not specified - PPO | Source: Ambulatory Visit | Attending: Cardiology | Admitting: Cardiology

## 2019-01-13 DIAGNOSIS — Z20828 Contact with and (suspected) exposure to other viral communicable diseases: Secondary | ICD-10-CM | POA: Diagnosis not present

## 2019-01-13 DIAGNOSIS — Z01812 Encounter for preprocedural laboratory examination: Secondary | ICD-10-CM | POA: Insufficient documentation

## 2019-01-14 LAB — NOVEL CORONAVIRUS, NAA (HOSP ORDER, SEND-OUT TO REF LAB; TAT 18-24 HRS): SARS-CoV-2, NAA: NOT DETECTED

## 2019-01-15 NOTE — Telephone Encounter (Signed)
[employer] to ship a 14 day cardiac event monitor to the patients home.  Instructions reviewed briefly as they are included in the monitor kit. 

## 2019-01-16 ENCOUNTER — Telehealth (HOSPITAL_COMMUNITY): Payer: Self-pay

## 2019-01-16 NOTE — Telephone Encounter (Signed)
Encounter complete. 

## 2019-01-17 ENCOUNTER — Other Ambulatory Visit: Payer: Self-pay

## 2019-01-17 ENCOUNTER — Ambulatory Visit (HOSPITAL_COMMUNITY)
Admission: RE | Admit: 2019-01-17 | Discharge: 2019-01-17 | Disposition: A | Discharge status: Home / Self Care | Payer: Federal, State, Local not specified - PPO | Source: Ambulatory Visit | Attending: Cardiology | Admitting: Cardiology

## 2019-01-17 DIAGNOSIS — R0609 Other forms of dyspnea: Secondary | ICD-10-CM

## 2019-01-17 DIAGNOSIS — R0600 Dyspnea, unspecified: Secondary | ICD-10-CM | POA: Diagnosis not present

## 2019-01-17 LAB — EXERCISE TOLERANCE TEST
Estimated workload: 10.1 METS
Exercise duration (min): 8 min
Exercise duration (sec): 1 s
MPHR: 160 {beats}/min
Peak HR: 151 {beats}/min
Percent HR: 94 %
RPE: 17
Rest HR: 75 {beats}/min

## 2019-01-22 ENCOUNTER — Ambulatory Visit (INDEPENDENT_AMBULATORY_CARE_PROVIDER_SITE_OTHER): Payer: Federal, State, Local not specified - PPO

## 2019-01-22 DIAGNOSIS — R0609 Other forms of dyspnea: Secondary | ICD-10-CM

## 2019-01-22 DIAGNOSIS — R0600 Dyspnea, unspecified: Secondary | ICD-10-CM

## 2019-01-22 DIAGNOSIS — R000 Tachycardia, unspecified: Secondary | ICD-10-CM | POA: Diagnosis not present

## 2019-02-15 ENCOUNTER — Other Ambulatory Visit: Payer: Self-pay | Admitting: Cardiology

## 2019-02-15 DIAGNOSIS — R0609 Other forms of dyspnea: Secondary | ICD-10-CM

## 2019-02-15 DIAGNOSIS — R000 Tachycardia, unspecified: Secondary | ICD-10-CM

## 2019-02-15 DIAGNOSIS — R0600 Dyspnea, unspecified: Secondary | ICD-10-CM

## 2019-03-01 DIAGNOSIS — R0609 Other forms of dyspnea: Secondary | ICD-10-CM | POA: Insufficient documentation

## 2019-03-01 DIAGNOSIS — E785 Hyperlipidemia, unspecified: Secondary | ICD-10-CM | POA: Insufficient documentation

## 2019-03-01 DIAGNOSIS — R0600 Dyspnea, unspecified: Secondary | ICD-10-CM | POA: Insufficient documentation

## 2019-03-01 DIAGNOSIS — Z7189 Other specified counseling: Secondary | ICD-10-CM | POA: Insufficient documentation

## 2019-03-01 DIAGNOSIS — R002 Palpitations: Secondary | ICD-10-CM | POA: Insufficient documentation
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
## 2019-03-02 ENCOUNTER — Ambulatory Visit: Payer: Federal, State, Local not specified - PPO | Admitting: Cardiology

## 2019-03-02 ENCOUNTER — Other Ambulatory Visit: Payer: Self-pay

## 2019-03-02 ENCOUNTER — Encounter: Payer: Self-pay | Admitting: Cardiology

## 2019-03-02 VITALS — BP 142/85 | HR 100 | Ht 64.5 in | Wt 147.0 lb

## 2019-03-02 DIAGNOSIS — R0600 Dyspnea, unspecified: Secondary | ICD-10-CM | POA: Diagnosis not present

## 2019-03-02 DIAGNOSIS — Z7189 Other specified counseling: Secondary | ICD-10-CM

## 2019-03-02 DIAGNOSIS — R0609 Other forms of dyspnea: Secondary | ICD-10-CM

## 2019-03-02 DIAGNOSIS — R002 Palpitations: Secondary | ICD-10-CM | POA: Diagnosis not present

## 2019-03-02 DIAGNOSIS — E785 Hyperlipidemia, unspecified: Secondary | ICD-10-CM | POA: Diagnosis not present

## 2019-03-02 MED ORDER — PROPRANOLOL HCL 10 MG PO TABS: 10.0000 mg | ORAL_TABLET | Freq: Three times a day (TID) | ORAL | 4 refills | Status: DC | PRN

## 2019-03-02 NOTE — Patient Instructions (Addendum)
Medication Instructions:  Your physician has recommended you make the following change in your medication:   START PROPRANOLOL (INDERAL ) 10 MG. ONE TABLET EVERY 8 HOURS AS NEEDED FOR PALPITATIONS   *If you need a refill on your cardiac medications before your next appointment, please call your pharmacy*  Lab Work: NONE If you have labs (blood work) drawn today and your tests are completely normal, you will receive your results only by: Marland Kitchen MyChart Message (if you have MyChart) OR . A paper copy in the mail If you have any lab test that is abnormal or we need to change your treatment, we will call you to review the results.  Testing/Procedures: NONE  Follow-Up: At [facility], you and your health needs are our priority.  As part of our continuing mission to provide you with exceptional heart care, we have created designated Provider Care Teams.  These Care Teams include your primary Cardiologist (physician) and Advanced Practice Providers (APPs -  Physician Assistants and Nurse Practitioners) who all work together to provide you with the care you need, when you need it.  Your next appointment:   AS NEEDED  The format for your next appointment:   Either In Person or Virtual  Provider:   You may see DR. HOCHREIN or one of the following Advanced Practice Providers on your designated Care Team:    Rosaria Ferries, PA-C  Jory Sims, DNP, ANP  Cadence Kathlen Mody, NP   Other Instructions ALIVECOR---KARDIA

## 2019-05-29 ENCOUNTER — Other Ambulatory Visit: Payer: Self-pay | Admitting: Cardiology

## 2019-11-20 ENCOUNTER — Other Ambulatory Visit: Payer: Self-pay

## 2019-11-20 MED ORDER — PROPRANOLOL HCL 10 MG PO TABS: 10.0000 mg | ORAL_TABLET | Freq: Three times a day (TID) | ORAL | 1 refills | Status: AC | PRN

## 2020-04-18 IMAGING — DX DG CHEST 2V
2 series · 2 of 2 positions shown · non-contrast
Comparison: Radiograph June 07, 2012.

CLINICAL DATA: Dyspnea on exertion.

EXAM:
CHEST - 2 VIEW

[dg chest 2 view (1 of 2)]
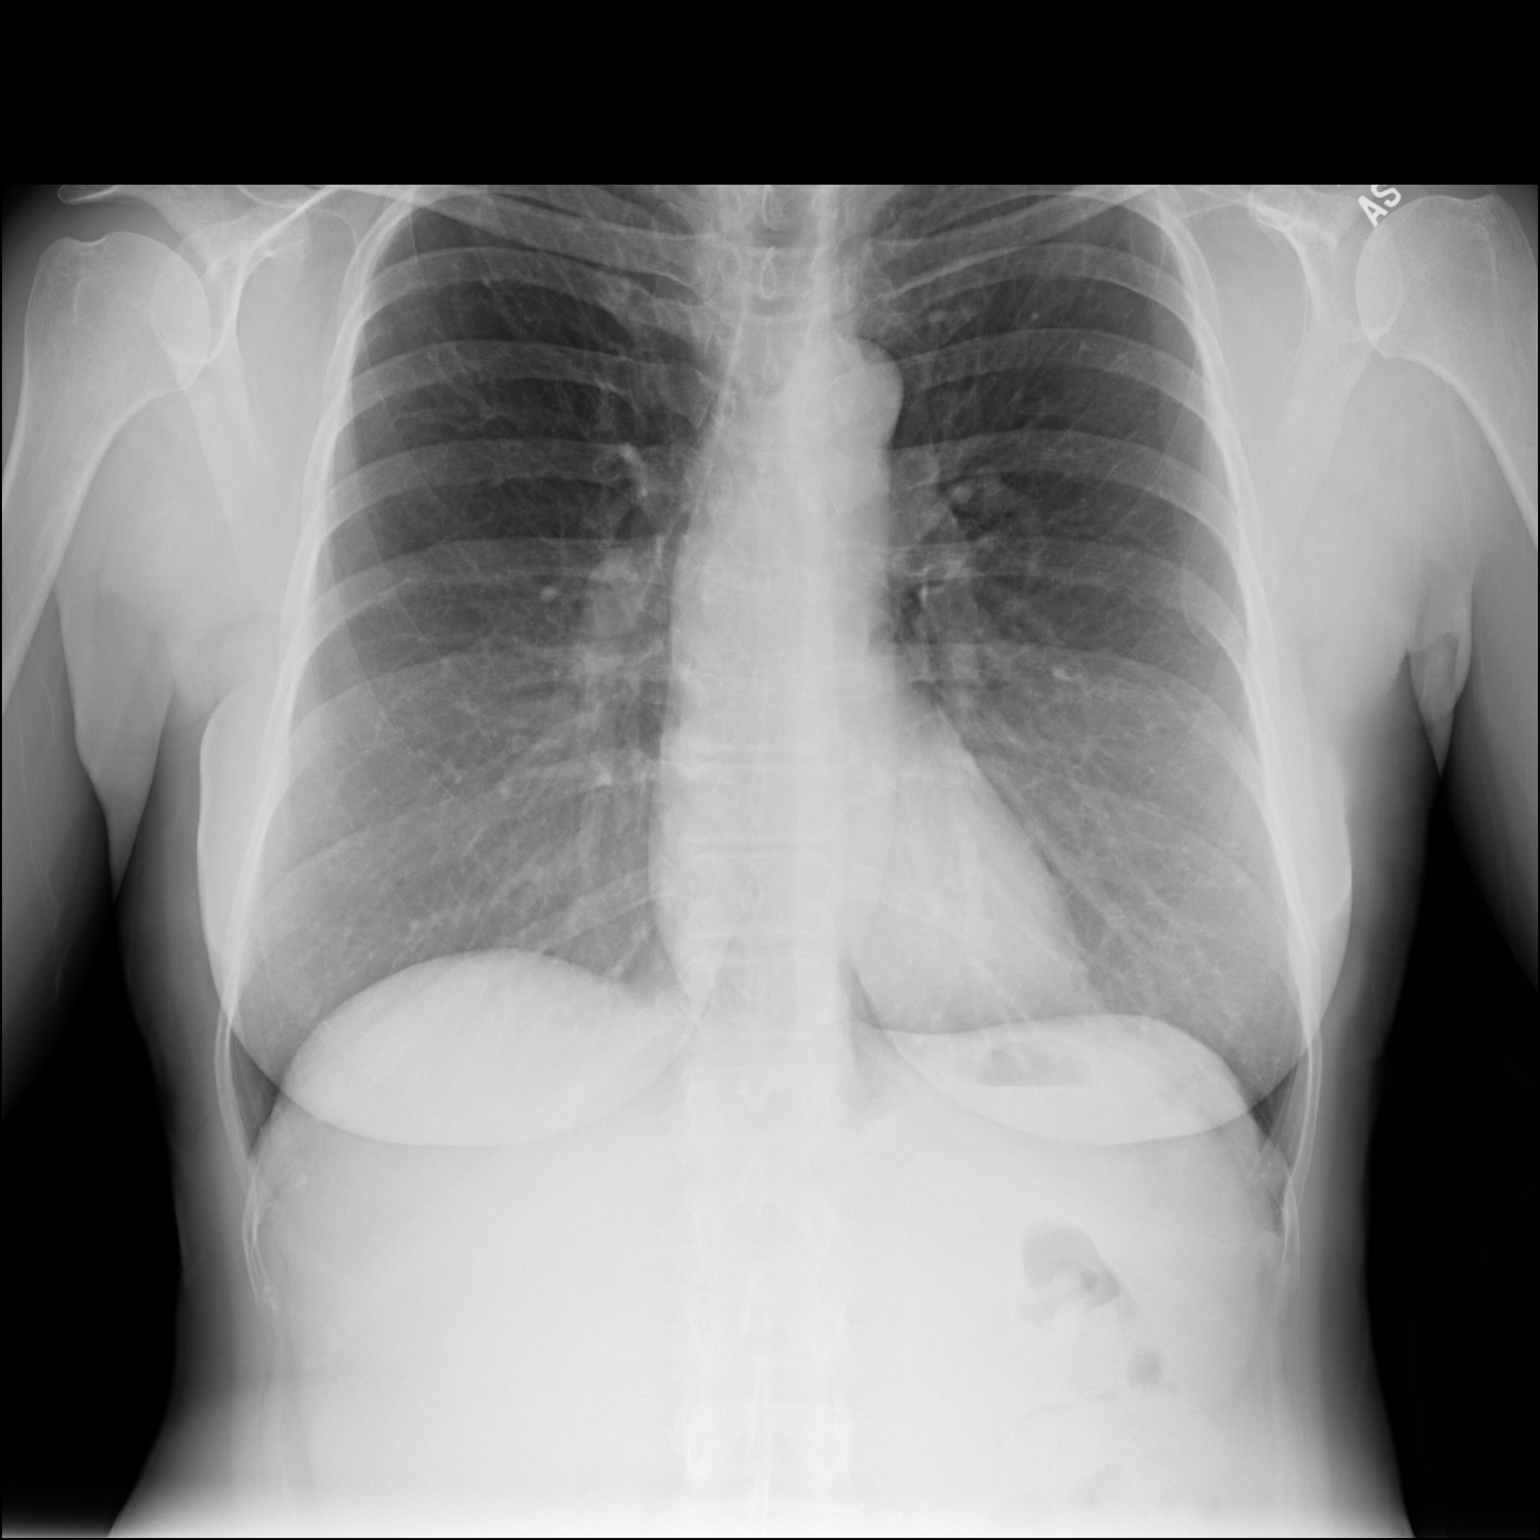

[dg chest 2 view (2 of 2)]
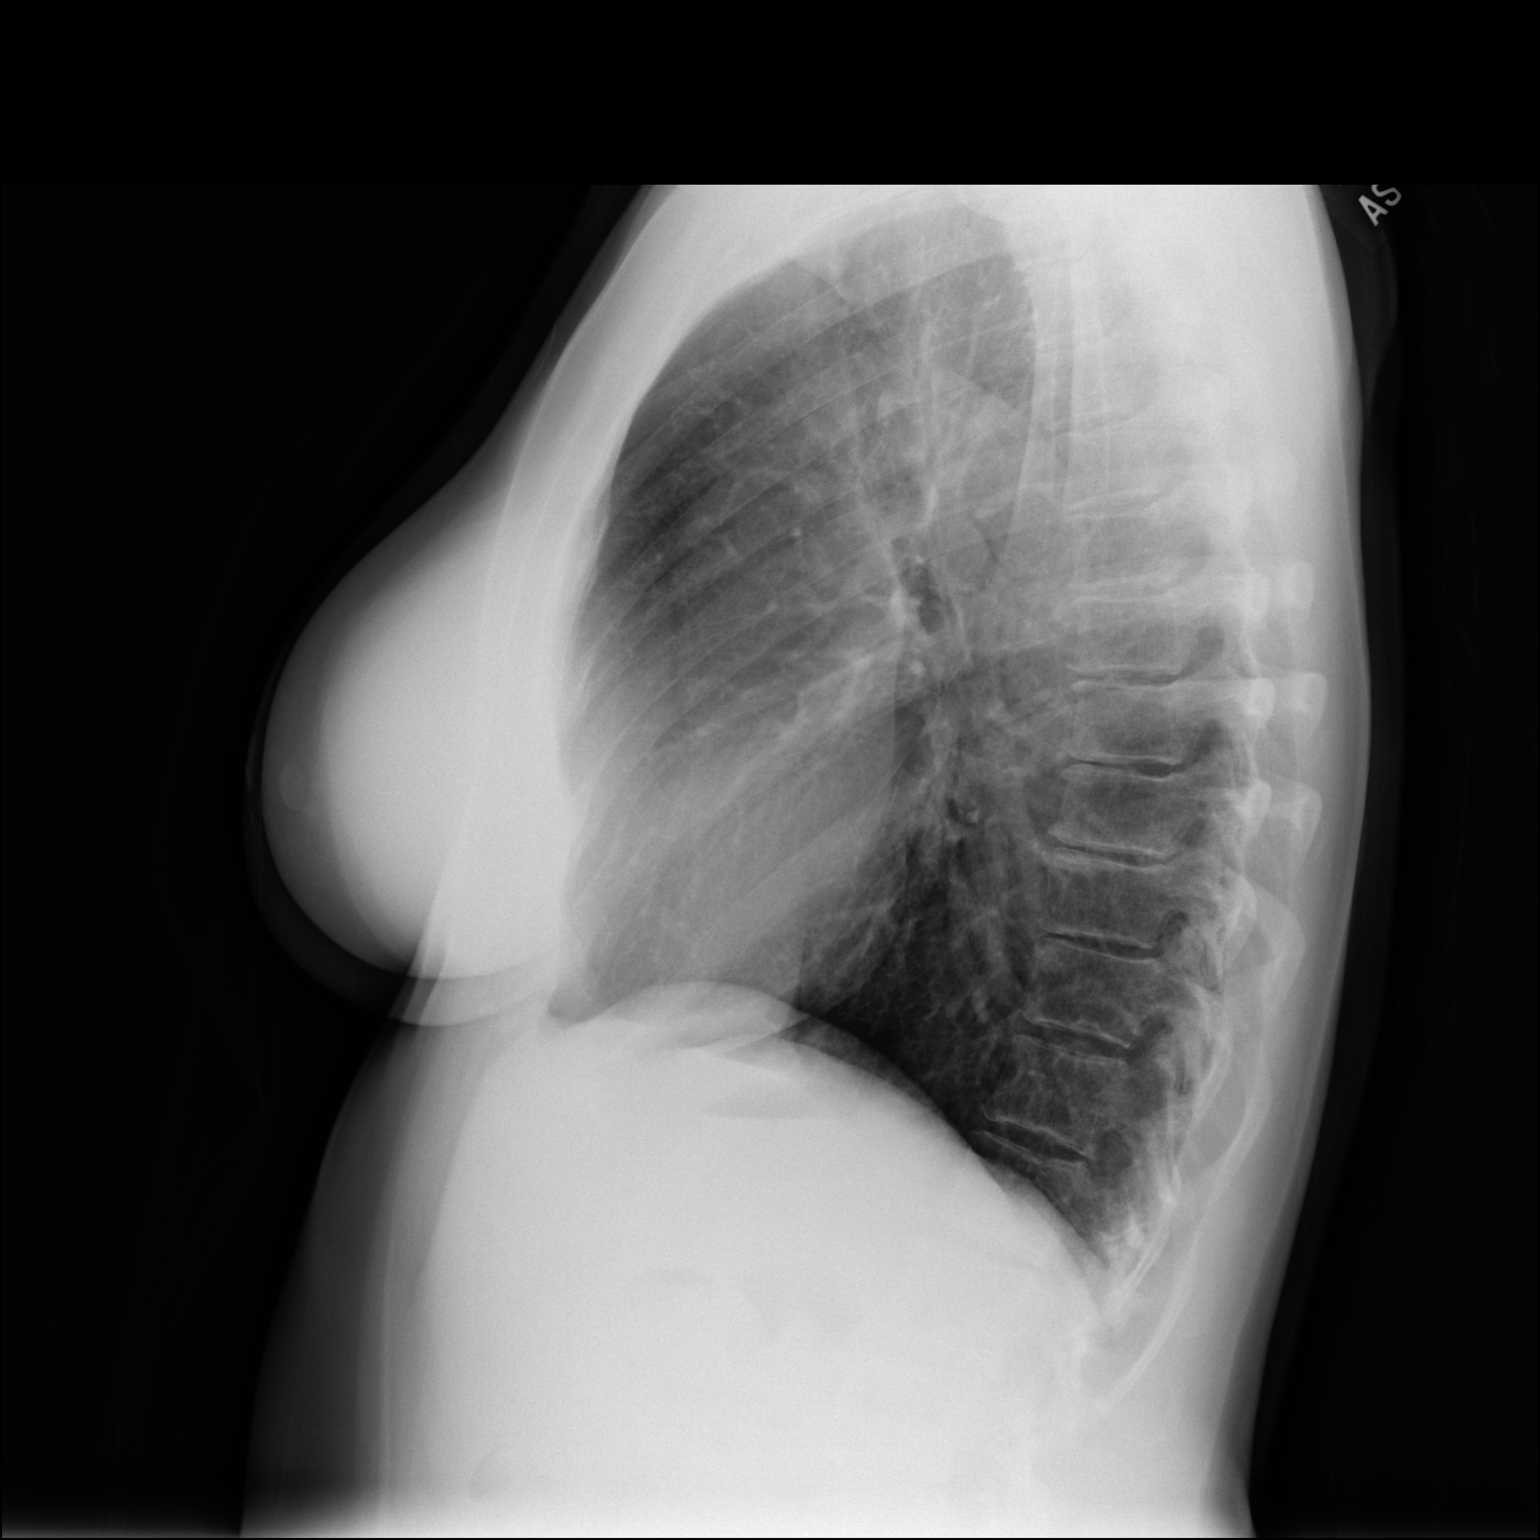

[2 of 2 positions shown; findings below may reference images not displayed]

FINDINGS: The heart size and mediastinal contours are within normal limits.
Both lungs are clear. No pneumothorax or pleural effusion is noted.
The visualized skeletal structures are unremarkable.
IMPRESSION: No active cardiopulmonary disease.

## 2020-08-12 DIAGNOSIS — H1032 Unspecified acute conjunctivitis, left eye: Secondary | ICD-10-CM | POA: Diagnosis not present

## 2020-08-19 DIAGNOSIS — H02844 Edema of left upper eyelid: Secondary | ICD-10-CM | POA: Diagnosis not present

## 2020-09-18 DIAGNOSIS — S6981XA Other specified injuries of right wrist, hand and finger(s), initial encounter: Secondary | ICD-10-CM | POA: Diagnosis not present

## 2020-09-18 DIAGNOSIS — M79644 Pain in right finger(s): Secondary | ICD-10-CM | POA: Diagnosis not present

## 2020-10-01 DIAGNOSIS — Z1231 Encounter for screening mammogram for malignant neoplasm of breast: Secondary | ICD-10-CM | POA: Diagnosis not present

## 2020-10-31 DIAGNOSIS — H2513 Age-related nuclear cataract, bilateral: Secondary | ICD-10-CM | POA: Diagnosis not present

## 2020-10-31 DIAGNOSIS — H43813 Vitreous degeneration, bilateral: Secondary | ICD-10-CM | POA: Diagnosis not present

## 2020-11-25 DIAGNOSIS — Z0000 Encounter for general adult medical examination without abnormal findings: Secondary | ICD-10-CM | POA: Diagnosis not present

## 2020-11-25 DIAGNOSIS — E559 Vitamin D deficiency, unspecified: Secondary | ICD-10-CM | POA: Diagnosis not present

## 2020-11-25 DIAGNOSIS — F900 Attention-deficit hyperactivity disorder, predominantly inattentive type: Secondary | ICD-10-CM | POA: Diagnosis not present

## 2020-11-25 DIAGNOSIS — Z23 Encounter for immunization: Secondary | ICD-10-CM | POA: Diagnosis not present

## 2020-11-25 DIAGNOSIS — R7303 Prediabetes: Secondary | ICD-10-CM | POA: Diagnosis not present

## 2020-11-25 DIAGNOSIS — M797 Fibromyalgia: Secondary | ICD-10-CM | POA: Diagnosis not present

## 2020-11-25 DIAGNOSIS — F419 Anxiety disorder, unspecified: Secondary | ICD-10-CM | POA: Diagnosis not present

## 2020-11-25 DIAGNOSIS — E785 Hyperlipidemia, unspecified: Secondary | ICD-10-CM | POA: Diagnosis not present

## 2020-11-25 DIAGNOSIS — F4310 Post-traumatic stress disorder, unspecified: Secondary | ICD-10-CM | POA: Diagnosis not present

## 2020-12-24 DIAGNOSIS — R87610 Atypical squamous cells of undetermined significance on cytologic smear of cervix (ASC-US): Secondary | ICD-10-CM | POA: Diagnosis not present

## 2020-12-24 DIAGNOSIS — R87810 Cervical high risk human papillomavirus (HPV) DNA test positive: Secondary | ICD-10-CM | POA: Diagnosis not present

## 2020-12-24 DIAGNOSIS — Z01419 Encounter for gynecological examination (general) (routine) without abnormal findings: Secondary | ICD-10-CM | POA: Diagnosis not present

## 2020-12-24 DIAGNOSIS — Z6825 Body mass index (BMI) 25.0-25.9, adult: Secondary | ICD-10-CM | POA: Diagnosis not present

## 2021-02-13 DIAGNOSIS — M21162 Varus deformity, not elsewhere classified, left knee: Secondary | ICD-10-CM | POA: Diagnosis not present

## 2021-02-13 DIAGNOSIS — M21161 Varus deformity, not elsewhere classified, right knee: Secondary | ICD-10-CM | POA: Diagnosis not present

## 2021-02-13 DIAGNOSIS — M170 Bilateral primary osteoarthritis of knee: Secondary | ICD-10-CM | POA: Diagnosis not present

## 2021-05-13 DIAGNOSIS — F4310 Post-traumatic stress disorder, unspecified: Secondary | ICD-10-CM | POA: Diagnosis not present

## 2021-05-13 DIAGNOSIS — F900 Attention-deficit hyperactivity disorder, predominantly inattentive type: Secondary | ICD-10-CM | POA: Diagnosis not present

## 2021-05-13 DIAGNOSIS — E785 Hyperlipidemia, unspecified: Secondary | ICD-10-CM | POA: Diagnosis not present

## 2021-05-13 DIAGNOSIS — F419 Anxiety disorder, unspecified: Secondary | ICD-10-CM | POA: Diagnosis not present

## 2021-05-13 DIAGNOSIS — Z23 Encounter for immunization: Secondary | ICD-10-CM | POA: Diagnosis not present

## 2021-07-04 DIAGNOSIS — S61422A Laceration with foreign body of left hand, initial encounter: Secondary | ICD-10-CM | POA: Diagnosis not present

## 2021-10-27 DIAGNOSIS — Z1231 Encounter for screening mammogram for malignant neoplasm of breast: Secondary | ICD-10-CM | POA: Diagnosis not present

## 2021-11-25 DIAGNOSIS — F900 Attention-deficit hyperactivity disorder, predominantly inattentive type: Secondary | ICD-10-CM | POA: Diagnosis not present

## 2021-11-25 DIAGNOSIS — F4310 Post-traumatic stress disorder, unspecified: Secondary | ICD-10-CM | POA: Diagnosis not present

## 2021-11-25 DIAGNOSIS — E559 Vitamin D deficiency, unspecified: Secondary | ICD-10-CM | POA: Diagnosis not present

## 2021-11-25 DIAGNOSIS — R7303 Prediabetes: Secondary | ICD-10-CM | POA: Diagnosis not present

## 2021-11-25 DIAGNOSIS — I10 Essential (primary) hypertension: Secondary | ICD-10-CM | POA: Diagnosis not present

## 2021-11-25 DIAGNOSIS — E785 Hyperlipidemia, unspecified: Secondary | ICD-10-CM | POA: Diagnosis not present

## 2021-11-25 DIAGNOSIS — Z23 Encounter for immunization: Secondary | ICD-10-CM | POA: Diagnosis not present

## 2021-11-25 DIAGNOSIS — Z0000 Encounter for general adult medical examination without abnormal findings: Secondary | ICD-10-CM | POA: Diagnosis not present

## 2021-11-25 DIAGNOSIS — Z5181 Encounter for therapeutic drug level monitoring: Secondary | ICD-10-CM | POA: Diagnosis not present

## 2022-03-03 DIAGNOSIS — F419 Anxiety disorder, unspecified: Secondary | ICD-10-CM | POA: Diagnosis not present

## 2022-03-03 DIAGNOSIS — I10 Essential (primary) hypertension: Secondary | ICD-10-CM | POA: Diagnosis not present

## 2022-03-03 DIAGNOSIS — F900 Attention-deficit hyperactivity disorder, predominantly inattentive type: Secondary | ICD-10-CM | POA: Diagnosis not present

## 2022-03-03 DIAGNOSIS — F330 Major depressive disorder, recurrent, mild: Secondary | ICD-10-CM | POA: Diagnosis not present

## 2022-03-25 DIAGNOSIS — M170 Bilateral primary osteoarthritis of knee: Secondary | ICD-10-CM | POA: Diagnosis not present

## 2022-03-26 DIAGNOSIS — Z01419 Encounter for gynecological examination (general) (routine) without abnormal findings: Secondary | ICD-10-CM | POA: Diagnosis not present

## 2022-03-26 DIAGNOSIS — Z6825 Body mass index (BMI) 25.0-25.9, adult: Secondary | ICD-10-CM | POA: Diagnosis not present

## 2022-06-03 DIAGNOSIS — K080 Exfoliation of teeth due to systemic causes: Secondary | ICD-10-CM | POA: Diagnosis not present

## 2022-09-20 DIAGNOSIS — M170 Bilateral primary osteoarthritis of knee: Secondary | ICD-10-CM | POA: Diagnosis not present

## 2022-12-01 ENCOUNTER — Other Ambulatory Visit (HOSPITAL_COMMUNITY): Payer: Self-pay | Admitting: Internal Medicine

## 2022-12-01 DIAGNOSIS — E785 Hyperlipidemia, unspecified: Secondary | ICD-10-CM | POA: Diagnosis not present

## 2022-12-01 DIAGNOSIS — Z8249 Family history of ischemic heart disease and other diseases of the circulatory system: Secondary | ICD-10-CM

## 2022-12-01 DIAGNOSIS — R7303 Prediabetes: Secondary | ICD-10-CM | POA: Diagnosis not present

## 2022-12-01 DIAGNOSIS — Z0000 Encounter for general adult medical examination without abnormal findings: Secondary | ICD-10-CM | POA: Diagnosis not present

## 2022-12-01 DIAGNOSIS — Z23 Encounter for immunization: Secondary | ICD-10-CM | POA: Diagnosis not present

## 2022-12-01 DIAGNOSIS — E559 Vitamin D deficiency, unspecified: Secondary | ICD-10-CM | POA: Diagnosis not present

## 2022-12-08 ENCOUNTER — Ambulatory Visit (HOSPITAL_COMMUNITY)
Admission: RE | Admit: 2022-12-08 | Discharge: 2022-12-08 | Disposition: A | Discharge status: Home / Self Care | Payer: Federal, State, Local not specified - PPO | Source: Ambulatory Visit | Attending: Internal Medicine | Admitting: Internal Medicine

## 2022-12-08 DIAGNOSIS — Z8249 Family history of ischemic heart disease and other diseases of the circulatory system: Secondary | ICD-10-CM | POA: Insufficient documentation

## 2022-12-21 DIAGNOSIS — Z1231 Encounter for screening mammogram for malignant neoplasm of breast: Secondary | ICD-10-CM | POA: Diagnosis not present

## 2023-01-25 DIAGNOSIS — K635 Polyp of colon: Secondary | ICD-10-CM | POA: Diagnosis not present

## 2023-01-25 DIAGNOSIS — K648 Other hemorrhoids: Secondary | ICD-10-CM | POA: Diagnosis not present

## 2023-01-25 DIAGNOSIS — K5730 Diverticulosis of large intestine without perforation or abscess without bleeding: Secondary | ICD-10-CM | POA: Diagnosis not present

## 2023-01-25 DIAGNOSIS — Z1211 Encounter for screening for malignant neoplasm of colon: Secondary | ICD-10-CM | POA: Diagnosis not present

## 2023-02-17 DIAGNOSIS — G8929 Other chronic pain: Secondary | ICD-10-CM | POA: Diagnosis not present

## 2023-02-17 DIAGNOSIS — M25562 Pain in left knee: Secondary | ICD-10-CM | POA: Diagnosis not present

## 2023-04-11 DIAGNOSIS — R070 Pain in throat: Secondary | ICD-10-CM | POA: Diagnosis not present

## 2023-04-11 DIAGNOSIS — R051 Acute cough: Secondary | ICD-10-CM | POA: Diagnosis not present

## 2023-04-11 DIAGNOSIS — R509 Fever, unspecified: Secondary | ICD-10-CM | POA: Diagnosis not present

## 2023-04-11 DIAGNOSIS — R0981 Nasal congestion: Secondary | ICD-10-CM | POA: Diagnosis not present

## 2023-04-12 ENCOUNTER — Ambulatory Visit: Payer: Federal, State, Local not specified - PPO

## 2023-06-02 DIAGNOSIS — R7303 Prediabetes: Secondary | ICD-10-CM | POA: Diagnosis not present

## 2023-06-02 DIAGNOSIS — I10 Essential (primary) hypertension: Secondary | ICD-10-CM | POA: Diagnosis not present

## 2023-06-02 DIAGNOSIS — F4310 Post-traumatic stress disorder, unspecified: Secondary | ICD-10-CM | POA: Diagnosis not present

## 2023-06-02 DIAGNOSIS — F900 Attention-deficit hyperactivity disorder, predominantly inattentive type: Secondary | ICD-10-CM | POA: Diagnosis not present
